# Patient Record
Sex: Male | Born: 1970 | Race: White | Hispanic: No | Marital: Married | Smoking: Never smoker
Health system: Southern US, Community
[De-identification: ages and names within clinical notes are randomized; demographics above are authoritative.]

## PROBLEM LIST (undated history)

## (undated) DIAGNOSIS — T8859XA Other complications of anesthesia, initial encounter: Secondary | ICD-10-CM

## (undated) DIAGNOSIS — R112 Nausea with vomiting, unspecified: Secondary | ICD-10-CM

## (undated) DIAGNOSIS — D680 Von Willebrand's disease: Principal | ICD-10-CM

## (undated) DIAGNOSIS — D699 Hemorrhagic condition, unspecified: Principal | ICD-10-CM

## (undated) DIAGNOSIS — Z9889 Other specified postprocedural states: Secondary | ICD-10-CM

## (undated) DIAGNOSIS — D691 Qualitative platelet defects: Secondary | ICD-10-CM

## (undated) DIAGNOSIS — G4730 Sleep apnea, unspecified: Secondary | ICD-10-CM

## (undated) DIAGNOSIS — C4490 Unspecified malignant neoplasm of skin, unspecified: Secondary | ICD-10-CM

## (undated) DIAGNOSIS — E119 Type 2 diabetes mellitus without complications: Secondary | ICD-10-CM

## (undated) DIAGNOSIS — I10 Essential (primary) hypertension: Secondary | ICD-10-CM

## (undated) DIAGNOSIS — M25569 Pain in unspecified knee: Secondary | ICD-10-CM

## (undated) DIAGNOSIS — D689 Coagulation defect, unspecified: Secondary | ICD-10-CM

## (undated) DIAGNOSIS — Z8052 Family history of malignant neoplasm of bladder: Secondary | ICD-10-CM

## (undated) HISTORY — DX: Qualitative platelet defects: D69.1

## (undated) HISTORY — DX: Hemorrhagic condition, unspecified: D69.9

## (undated) HISTORY — DX: Pain in unspecified knee: M25.569

## (undated) HISTORY — DX: Type 2 diabetes mellitus without complications: E11.9

## (undated) HISTORY — DX: Coagulation defect, unspecified: D68.9

## (undated) HISTORY — DX: Essential (primary) hypertension: I10

## (undated) HISTORY — DX: Unspecified malignant neoplasm of skin, unspecified: C44.90

## (undated) HISTORY — DX: Von Willebrand's disease: D68.0

## (undated) HISTORY — DX: Family history of malignant neoplasm of bladder: Z80.52

---

## 1998-06-23 HISTORY — PX: TONSILLECTOMY: SUR1361

## 2008-06-23 HISTORY — PX: KNEE ARTHROSCOPY: SHX127

## 2013-03-10 ENCOUNTER — Telehealth: Payer: Self-pay | Admitting: Hematology and Oncology

## 2013-03-10 NOTE — Telephone Encounter (Signed)
LVOM FOR PT TO RETURN CALL IN RE TO REFERRAL.  °

## 2013-03-14 ENCOUNTER — Telehealth: Payer: Self-pay | Admitting: Hematology and Oncology

## 2013-03-14 NOTE — Telephone Encounter (Signed)
LM WITH WIFE FOR PT TO RETURN CALL.

## 2013-03-16 ENCOUNTER — Telehealth: Payer: Self-pay | Admitting: Hematology and Oncology

## 2013-03-16 NOTE — Telephone Encounter (Signed)
S/w pt and gve np appt 10/07 @ 9:30 w/Dr. Bertis Ruddy Referring Dr. Salvatore Marvel  Dx- Pre-op clarence von willebrandsdx Welcome packet mailed.

## 2013-03-16 NOTE — Telephone Encounter (Signed)
C/D 03/16/13 for appt. 03/29/13 °

## 2013-03-16 NOTE — Telephone Encounter (Signed)
C/D 03/16/13 for appt. 03/29/13

## 2013-03-29 ENCOUNTER — Encounter: Payer: Self-pay | Admitting: Hematology and Oncology

## 2013-03-29 ENCOUNTER — Other Ambulatory Visit: Payer: Self-pay | Admitting: Lab

## 2013-03-29 ENCOUNTER — Ambulatory Visit (HOSPITAL_BASED_OUTPATIENT_CLINIC_OR_DEPARTMENT_OTHER): Payer: BC Managed Care – PPO | Admitting: Hematology and Oncology

## 2013-03-29 ENCOUNTER — Ambulatory Visit (HOSPITAL_BASED_OUTPATIENT_CLINIC_OR_DEPARTMENT_OTHER): Payer: BC Managed Care – PPO

## 2013-03-29 ENCOUNTER — Ambulatory Visit (HOSPITAL_BASED_OUTPATIENT_CLINIC_OR_DEPARTMENT_OTHER): Payer: BC Managed Care – PPO | Admitting: Lab

## 2013-03-29 ENCOUNTER — Other Ambulatory Visit: Payer: Self-pay | Admitting: Hematology and Oncology

## 2013-03-29 VITALS — BP 162/104 | HR 69 | Temp 97.8°F | Resp 20 | Ht 73.0 in | Wt 307.4 lb

## 2013-03-29 DIAGNOSIS — D6800 Von Willebrand disease, unspecified: Secondary | ICD-10-CM

## 2013-03-29 DIAGNOSIS — M25562 Pain in left knee: Secondary | ICD-10-CM

## 2013-03-29 DIAGNOSIS — M25569 Pain in unspecified knee: Secondary | ICD-10-CM

## 2013-03-29 HISTORY — DX: Pain in unspecified knee: M25.569

## 2013-03-29 HISTORY — DX: Von Willebrand's disease: D68.0

## 2013-03-29 HISTORY — DX: Von Willebrand disease, unspecified: D68.00

## 2013-03-29 LAB — CBC WITH DIFFERENTIAL/PLATELET
BASO%: 2.9 % — ABNORMAL HIGH (ref 0.0–2.0)
Basophils Absolute: 0.1 10*3/uL (ref 0.0–0.1)
EOS%: 3.1 % (ref 0.0–7.0)
Eosinophils Absolute: 0.1 10*3/uL (ref 0.0–0.5)
HCT: 40 % (ref 38.4–49.9)
HGB: 13.5 g/dL (ref 13.0–17.1)
LYMPH%: 23.1 % (ref 14.0–49.0)
MCH: 27.9 pg (ref 27.2–33.4)
MCHC: 33.7 g/dL (ref 32.0–36.0)
MCV: 82.7 fL (ref 79.3–98.0)
MONO#: 0.4 10*3/uL (ref 0.1–0.9)
MONO%: 9.5 % (ref 0.0–14.0)
NEUT#: 2.6 10*3/uL (ref 1.5–6.5)
NEUT%: 61.4 % (ref 39.0–75.0)
Platelets: 180 10*3/uL (ref 140–400)
RBC: 4.83 10*6/uL (ref 4.20–5.82)
RDW: 13.3 % (ref 11.0–14.6)
WBC: 4.2 10*3/uL (ref 4.0–10.3)
lymph#: 1 10*3/uL (ref 0.9–3.3)

## 2013-03-29 LAB — COMPREHENSIVE METABOLIC PANEL (CC13)
ALT: 39 U/L (ref 0–55)
AST: 36 U/L — ABNORMAL HIGH (ref 5–34)
Albumin: 3.8 g/dL (ref 3.5–5.0)
Alkaline Phosphatase: 75 U/L (ref 40–150)
Anion Gap: 7 mEq/L (ref 3–11)
BUN: 13.1 mg/dL (ref 7.0–26.0)
CO2: 29 mEq/L (ref 22–29)
Calcium: 9.3 mg/dL (ref 8.4–10.4)
Chloride: 106 mEq/L (ref 98–109)
Creatinine: 0.8 mg/dL (ref 0.7–1.3)
Glucose: 104 mg/dl (ref 70–140)
Potassium: 4.1 mEq/L (ref 3.5–5.1)
Sodium: 142 mEq/L (ref 136–145)
Total Bilirubin: 0.52 mg/dL (ref 0.20–1.20)
Total Protein: 7.2 g/dL (ref 6.4–8.3)

## 2013-03-29 NOTE — Progress Notes (Signed)
Checked in new pt with no financial concerns. °

## 2013-03-29 NOTE — Progress Notes (Signed)
[facility] CONSULT NOTE  CHIEF COMPLAINTS/PURPOSE OF CONSULTATION: Von Willebrand disease, needs surgical clearance prior to knee replacement surgery  HISTORY OF PRESENTING ILLNESS:  Paul Curry 42 y.o. male is a pleasant gentleman with a prior history of von Willebrand disease. The patient apparently was diagnosed at 34-year-old. At that time, the patient bit his tongue by accident and the bled continuously for one week. He denies any need for blood transfusion at that time. After he was diagnosed, he was being followed by a hematologist. According to the patient, he was circumcised at birth but he never had problem that he is aware of. At around age 40, he had tonsillectomy without any hematological workup. He bled for one week. He subsequently had to go back into the hospital and have the bleeding site cauterized to stop the bleeding. Again he denies any need for blood transfusion. The patient has cyst and skin cancer removed from his body on and off over the years and at one time he required some sort of infusion which he believes the DDAVP. His maternal grandmother had von Willebrand disease. His mother was never symptomatic. His son is born normal without any bleeding disorder but his daughter was tested positive. She requires birth control to stop her heavy bleeding. He uses a hematologist in week for [name] but was lost to followup over the past few years. The patient does complain of easy bruising. He denies any spontaneous bleeding such as epistaxis, hematuria, or hematochezia. The patient injured his left knee since age 64 and over the past many years needed multiple arthroscopy evaluation. Subsequently he was recommended to have a knee replacement surgery and hence he is being referred her for surgical clearance due to his history of bleeding disorder for his knee pain, has been taking some ibuprofen regularly.Marland Kitchen   MEDICAL HISTORY:  Past Medical History  Diagnosis Date  .  Hypertension   . Skin cancer   . Clotting disorder   . Von Willebrand disease 03/29/2013  . Knee joint pain 03/29/2013    SURGICAL HISTORY: Past Surgical History  Procedure Laterality Date  . Tonsillectomy Bilateral 2000  . Knee arthroscopy Left 2010    SOCIAL HISTORY: History   Social History  . Marital Status: Married    Spouse Name: N/A    Number of Children: N/A  . Years of Education: N/A   Occupational History  . Not on file.   Social History Main Topics  . Smoking status: Never Smoker   . Smokeless tobacco: Never Used  . Alcohol Use: 0.6 oz/week    1 Glasses of wine per week  . Drug Use: No  . Sexual Activity: Not on file   Other Topics Concern  . Not on file   Social History Narrative  . No narrative on file    FAMILY HISTORY: Family History  Problem Relation Age of Onset  . Clotting disorder Mother     possibly, not diagnosed  . Clotting disorder Maternal Grandmother     possibly had it, not formally diagnosed    ALLERGIES:  has no allergies on file.  MEDICATIONS: Current outpatient prescriptions:amLODipine (NORVASC) 5 MG tablet, Take 5 mg by mouth daily., Disp: , Rfl: ;  hydrochlorothiazide (HYDRODIURIL) 12.5 MG tablet, , Disp: , Rfl: ;  montelukast (SINGULAIR) 10 MG tablet, Take 10 mg by mouth at bedtime., Disp: , Rfl: ;  omeprazole (PRILOSEC) 20 MG capsule, Take 20 mg by mouth daily., Disp: , Rfl: ;  PARoxetine (PAXIL)  20 MG tablet, , Disp: , Rfl:  HYDROcodone-acetaminophen (NORCO/VICODIN) 5-325 MG per tablet, , Disp: , Rfl:   REVIEW OF SYSTEMS:   Constitutional: Denies fevers, chills or abnormal weight loss Eyes: Denies blurriness of vision, double vision or watery eyes Ears, nose, mouth, throat, and face: Denies mucositis or sore throat Respiratory: Denies cough, dyspnea or wheezes Cardiovascular: Denies palpitation, chest discomfort or lower extremity swelling Gastrointestinal:  Denies nausea, heartburn or change in bowel habits Skin: Denies  abnormal skin rashes Lymphatics: Denies new lymphadenopathy or easy bruising Neurological:Denies numbness, tingling or new weaknesses Behavioral/Psych: Mood is stable, no new changes  All other systems were reviewed with the patient and are negative.  PHYSICAL EXAMINATION: ECOG PERFORMANCE STATUS: 1 - Symptomatic but completely ambulatory  Filed Vitals:   03/29/13 1005  BP: 162/104  Pulse: 69  Temp: 97.8 F (36.6 C)  Resp: 20   Filed Weights   03/29/13 1005  Weight: 307 lb 6.4 oz (139.436 kg)    GENERAL:alert, no distress and comfortable patient is moderately obese  SKIN: skin color, texture, turgor are normal, no rashes or significant lesions EYES: normal, conjunctiva are pink and non-injected, sclera clear OROPHARYNX:no exudate, no erythema and lips, buccal mucosa, and tongue normal  NECK: supple, thyroid normal size, non-tender, without nodularity LYMPH:  no palpable lymphadenopathy in the cervical, axillary or inguinal LUNGS: clear to auscultation and percussion with normal breathing effort HEART: regular rate & rhythm and no murmurs and no lower extremity edema ABDOMEN:abdomen soft, non-tender and normal bowel sounds Musculoskeletal:no cyanosis of digits and no clubbing  his left knee looks normal although this a brace over it is no evidence of bleeding into the knee joint  PSYCH: alert & oriented x 3 with fluent speech NEURO: no focal motor/sensory deficits  LABORATORY DATA:  I have reviewed the data as listed Recent Results (from the past 2160 hour(s))  CBC WITH DIFFERENTIAL     Status: Abnormal   Collection Time    03/29/13 11:04 AM      Result Value Range   WBC 4.2  4.0 - 10.3 10e3/uL   NEUT# 2.6  1.5 - 6.5 10e3/uL   HGB 13.5  13.0 - 17.1 g/dL   HCT 04.5  40.9 - 81.1 %   Platelets 180  140 - 400 10e3/uL   MCV 82.7  79.3 - 98.0 fL   MCH 27.9  27.2 - 33.4 pg   MCHC 33.7  32.0 - 36.0 g/dL   RBC 9.14  7.82 - 9.56 10e6/uL   RDW 13.3  11.0 - 14.6 %   lymph# 1.0   0.9 - 3.3 10e3/uL   MONO# 0.4  0.1 - 0.9 10e3/uL   Eosinophils Absolute 0.1  0.0 - 0.5 10e3/uL   Basophils Absolute 0.1  0.0 - 0.1 10e3/uL   NEUT% 61.4  39.0 - 75.0 %   LYMPH% 23.1  14.0 - 49.0 %   MONO% 9.5  0.0 - 14.0 %   EOS% 3.1  0.0 - 7.0 %   BASO% 2.9 (*) 0.0 - 2.0 %  COMPREHENSIVE METABOLIC PANEL (CC13)     Status: Abnormal   Collection Time    03/29/13 11:05 AM      Result Value Range   Sodium 142  136 - 145 mEq/L   Potassium 4.1  3.5 - 5.1 mEq/L   Chloride 106  98 - 109 mEq/L   CO2 29  22 - 29 mEq/L   Glucose 104  70 - 140 mg/dl  BUN 13.1  7.0 - 26.0 mg/dL   Creatinine 0.8  0.7 - 1.3 mg/dL   Total Bilirubin 9.56  0.20 - 1.20 mg/dL   Alkaline Phosphatase 75  40 - 150 U/L   AST 36 (*) 5 - 34 U/L   ALT 39  0 - 55 U/L   Total Protein 7.2  6.4 - 8.3 g/dL   Albumin 3.8  3.5 - 5.0 g/dL   Calcium 9.3  8.4 - 21.3 mg/dL   Anion Gap 7  3 - 11 mEq/L   ASSESSMENT:  Von Willebrand disease, requiring clearance for knee replacement surgery and perioperative management   PLAN:  #1 von Willebrand disease Discuss with the patient I am uncertain of the type of von Willebrand disease that he has and the degree of severity. Even he has mild von Willebrand disease, we could potentially bring him back for a DDAVP challenge and if you respond well to DDAVP challenge, we can manage him in the perioperative setting in here. However, if he has severe von Willebrand disease, then I would recommend referral to tertiary center for further management. In the meantime I will go ahead and order some blood work today for further evaluation. In the meantime, his left knee pain is not going to require urgent surgery and will plan this is as an elective surgery until this workup is complete. #2 severe knee pain I recommended he avoid nonsteroidal anti-inflammatory medication and to use over the counter extra strength Tylenol only for pain control.  All questions were answered. The patient knows to call the  clinic with any problems, questions or concerns. I can certainly see the patient much sooner if necessary. No barriers to learning was detected.  I spent 25 minutes counseling the patient face to face. The total time spent in the appointment was 48 minutes and more than 50% was on counseling.     [facility], Lillyauna Jenkinson, MD 03/29/2013 12:47 PM

## 2013-04-01 ENCOUNTER — Telehealth: Payer: Self-pay | Admitting: *Deleted

## 2013-04-01 LAB — VON WILLEBRAND FACTOR MULTIMER
Factor-VIII Activity: 128 % (ref 50–180)
Ristocetin Co-Factor: 97 % (ref 42–200)
Von Willebrand Factor Ag: 98 % (ref 50–217)

## 2013-04-01 LAB — PROTHROMBIN TIME
INR: 1 (ref <1.50)
Prothrombin Time: 13.2 seconds (ref 11.6–15.2)

## 2013-04-01 LAB — APTT: aPTT: 37 seconds (ref 24–37)

## 2013-04-01 NOTE — Telephone Encounter (Signed)
VM from [facility] at Dr. Sherene Sires office.  She requests our fax number to send [name] form for release for surgery from Hematology standpoint.  Attempted to return call after 5 pm and was unable to leave a message.  Request desk nurse to return her call w/ our fax number on Monday.   Contact number is #[phone],  Ext [phone].

## 2013-04-12 ENCOUNTER — Ambulatory Visit: Payer: BC Managed Care – PPO | Admitting: Hematology and Oncology

## 2013-04-12 ENCOUNTER — Other Ambulatory Visit: Payer: Self-pay | Admitting: Hematology and Oncology

## 2013-04-12 ENCOUNTER — Telehealth: Payer: Self-pay | Admitting: *Deleted

## 2013-04-12 ENCOUNTER — Encounter: Payer: Self-pay | Admitting: Hematology and Oncology

## 2013-04-12 DIAGNOSIS — D699 Hemorrhagic condition, unspecified: Secondary | ICD-10-CM

## 2013-04-12 HISTORY — DX: Hemorrhagic condition, unspecified: D69.9

## 2013-04-12 NOTE — Telephone Encounter (Signed)
Pt was unaware of his visit today,  Thought it was for this Thursday.  He can come in for lab tomorrow at 10 am.

## 2013-04-12 NOTE — Telephone Encounter (Signed)
I will put in order for tomorrow for platelet aggregation study and see me about 1 week after, 15 minutes appt

## 2013-04-12 NOTE — Telephone Encounter (Signed)
Placed order

## 2013-04-12 NOTE — Telephone Encounter (Signed)
Message copied by Wende Mott on Tue Apr 12, 2013  2:18 PM ------      Message from: [facility] [place]      Created: Tue Apr 12, 2013  1:40 PM       Patient is a no-show today            Work-up was negative but he did not have platelet aggregation study done            Would you mind call him and see if he is willing to coming for additional testing so we can clear him for surgery?            Thanks ------

## 2013-04-13 ENCOUNTER — Other Ambulatory Visit: Payer: BC Managed Care – PPO | Admitting: Lab

## 2013-04-13 DIAGNOSIS — D699 Hemorrhagic condition, unspecified: Secondary | ICD-10-CM

## 2013-04-25 ENCOUNTER — Ambulatory Visit (HOSPITAL_BASED_OUTPATIENT_CLINIC_OR_DEPARTMENT_OTHER): Payer: BC Managed Care – PPO | Admitting: Hematology and Oncology

## 2013-04-25 VITALS — BP 171/101 | HR 79 | Temp 98.1°F | Resp 21 | Ht 73.0 in | Wt 318.4 lb

## 2013-04-25 DIAGNOSIS — M1990 Unspecified osteoarthritis, unspecified site: Secondary | ICD-10-CM

## 2013-04-25 DIAGNOSIS — D699 Hemorrhagic condition, unspecified: Secondary | ICD-10-CM

## 2013-04-25 DIAGNOSIS — M25562 Pain in left knee: Secondary | ICD-10-CM

## 2013-04-25 LAB — PLATELET AGGREGATION STUDY, BLOOD
ADP10: NORMAL
ADP5: NORMAL
Arachidonic Acid: NORMAL
COMMENT (PLATELET AGGREGATE): NORMAL
Collagen Aggregation: NORMAL
Epinephrine 10: NORMAL
Ristocetin: DECREASED
Thrombin Receptor: NORMAL

## 2013-04-25 NOTE — Progress Notes (Signed)
[facility] OFFICE PROGRESS NOTE  No primary provider on file. DIAGNOSIS:  Unspecified bleeding disorder  SUMMARY OF HEMATOLOGIC HISTORY: This patient was referred here to get surgical clearance for prior to knee replacement surgery. He had prior diagnosis of von Willebrand's disease, unknown type. INTERVAL HISTORY: Paul Curry 42 y.o. male returns for further followup. The patient denies any recent signs or symptoms of bleeding such as spontaneous epistaxis, hematuria or hematochezia.  I have reviewed the past medical history, past surgical history, social history and family history with the patient and they are unchanged from previous note.  ALLERGIES:  has No Known Allergies.  MEDICATIONS:  Current Outpatient Prescriptions  Medication Sig Dispense Refill  . acetaminophen (TYLENOL) 325 MG tablet Take 650 mg by mouth every 6 (six) hours as needed for pain.      Marland Kitchen amLODipine (NORVASC) 5 MG tablet Take 5 mg by mouth daily.      . hydrochlorothiazide (HYDRODIURIL) 12.5 MG tablet Take 25 mg by mouth daily.       . montelukast (SINGULAIR) 10 MG tablet Take 10 mg by mouth at bedtime.      Marland Kitchen omeprazole (PRILOSEC) 20 MG capsule Take 20 mg by mouth daily.      Marland Kitchen PARoxetine (PAXIL) 20 MG tablet        No current facility-administered medications for this visit.     REVIEW OF SYSTEMS:   Constitutional: Denies fevers, chills or night sweats All other systems were reviewed with the patient and are negative.  PHYSICAL EXAMINATION: ECOG PERFORMANCE STATUS: 0 - Asymptomatic  Filed Vitals:   04/25/13 1336  BP: 171/101  Pulse: 79  Temp: 98.1 F (36.7 C)  Resp: 21   Filed Weights   04/25/13 1336  Weight: 318 lb 6.4 oz (144.425 kg)    GENERAL:alert, no distress and comfortable NEURO: alert & oriented x 3 with fluent speech, no focal motor/sensory deficits  LABORATORY DATA:  I have reviewed the data as listed. Von Willebrand panel came back normal. Platelet  aggregation studies show no spontaneous aggregation in 20 minutes with marked decreased response to Ristocetin   ASSESSMENT:  Bleeding disorder  PLAN:  #1 bleeding disorder #2 severe degenerative joint disease requiring surgery Unfortunately, I do not have a diagnosis. Von Willebrand panel came back normal. Platelet aggregation study came back abnormal. The patient has known bleeding history which suggested some sort of bleeding disorder. I discussed this with my colleagues and we both agreed that the patient should receive a second opinion at [facility] before he proceed with his knee surgery. Unfortunately I cannot clear him for surgery. I recommend referral as discussed above. The patient is in agreement to proceed. All questions were answered. The patient knows to call the clinic with any problems, questions or concerns. No barriers to learning was detected.  I spent 20 minutes counseling the patient face to face. The total time spent in the appointment was 25 minutes and more than 50% was on counseling.     [facility], Adiyah Lame, MD 04/25/2013 2:28 PM

## 2013-04-27 ENCOUNTER — Telehealth: Payer: Self-pay | Admitting: Hematology and Oncology

## 2013-04-27 NOTE — Telephone Encounter (Signed)
Faxed pt medical records to [facility].   They will call with appt.

## 2013-04-29 ENCOUNTER — Telehealth: Payer: Self-pay | Admitting: *Deleted

## 2013-04-29 NOTE — Telephone Encounter (Signed)
Pt left VM states he still has not heard about his referral to [facility]  S/w Laurel Dimmer in med records.  Referral was made on 11/05 and records were sent to [facility].  Informed pt of this and [facility] should be contacting him next week w/ appt d/t.  Please call us back if he doesn't hear anything by end of next week.  He verbalized understanding.

## 2013-05-06 ENCOUNTER — Telehealth: Payer: Self-pay | Admitting: Hematology and Oncology

## 2013-05-06 NOTE — Telephone Encounter (Signed)
Pt appt. With Dr. Marcheta Grammes is 06/10/13@10 :00. Medical records faxed. Pt is aware

## 2013-05-10 ENCOUNTER — Encounter (HOSPITAL_COMMUNITY): Payer: Self-pay | Admitting: Pharmacy Technician

## 2013-05-12 ENCOUNTER — Inpatient Hospital Stay (HOSPITAL_COMMUNITY): Admission: RE | Admit: 2013-05-12 | Payer: BC Managed Care – PPO | Source: Ambulatory Visit

## 2013-05-23 ENCOUNTER — Inpatient Hospital Stay (HOSPITAL_COMMUNITY)
Admission: RE | Admit: 2013-05-23 | Payer: BC Managed Care – PPO | Source: Ambulatory Visit | Admitting: Orthopedic Surgery

## 2013-05-23 ENCOUNTER — Encounter (HOSPITAL_COMMUNITY): Admission: RE | Payer: Self-pay | Source: Ambulatory Visit

## 2013-05-23 SURGERY — ARTHROPLASTY, KNEE, TOTAL
Anesthesia: General | Laterality: Left

## 2016-06-19 ENCOUNTER — Encounter: Payer: Self-pay | Admitting: Gastroenterology

## 2016-07-10 ENCOUNTER — Ambulatory Visit: Payer: Self-pay | Admitting: Gastroenterology

## 2016-07-31 ENCOUNTER — Encounter: Payer: Self-pay | Admitting: Gastroenterology

## 2016-07-31 ENCOUNTER — Telehealth: Payer: Self-pay | Admitting: Gastroenterology

## 2016-07-31 ENCOUNTER — Ambulatory Visit: Payer: Self-pay | Admitting: Gastroenterology

## 2016-07-31 NOTE — Telephone Encounter (Signed)
PATIENT WAS A NO SHOW AND LETTER SENT  °

## 2018-05-31 ENCOUNTER — Encounter: Payer: Self-pay | Admitting: Gastroenterology

## 2018-08-18 ENCOUNTER — Ambulatory Visit: Payer: Self-pay | Admitting: Gastroenterology

## 2018-08-18 ENCOUNTER — Encounter: Payer: Self-pay | Admitting: Gastroenterology

## 2019-08-24 ENCOUNTER — Encounter: Payer: Self-pay | Admitting: Internal Medicine

## 2019-09-17 ENCOUNTER — Encounter: Payer: Self-pay | Admitting: Gastroenterology

## 2019-09-17 NOTE — Progress Notes (Deleted)
Referring Provider: Melton Krebs, NP  Primary Care Physician:  System, Pcp Not In Primary Gastroenterologist:  Dr. Gala Romney  No chief complaint on file.   HPI:   Paul Curry is a 49 y.o. male presenting today at the request of Melton Krebs, NP for EGD and colonoscopy.   History of abnormal bleeding characterized by extensive hemorrhage after surgical procedures. Diagnosed with qualitative platelet defect, previously carried diagnosis of von Willebrand disease. Upon review on hematology notes from 2015, patient has been advised to have DDAVP and platelets prior to a knee replacement with overnight monitoring thereafter.     Past Medical History:  Diagnosis Date  . Bleeding disorder 04/12/2013  . Clotting disorder   . Hypertension   . Knee joint pain 03/29/2013  . Skin cancer   . Von Willebrand disease 03/29/2013    Past Surgical History:  Procedure Laterality Date  . KNEE ARTHROSCOPY Left 2010  . TONSILLECTOMY Bilateral 2000    Current Outpatient Medications  Medication Sig Dispense Refill  . acetaminophen (TYLENOL) 325 MG tablet Take 650 mg by mouth every 6 (six) hours as needed for pain.    Marland Kitchen amLODipine (NORVASC) 5 MG tablet Take 5 mg by mouth daily.    . hydrochlorothiazide (HYDRODIURIL) 12.5 MG tablet Take 25 mg by mouth daily.     . montelukast (SINGULAIR) 10 MG tablet Take 10 mg by mouth at bedtime.    Marland Kitchen omeprazole (PRILOSEC) 20 MG capsule Take 20 mg by mouth daily.    Marland Kitchen PARoxetine (PAXIL) 20 MG tablet Take 20 mg by mouth daily.     Marland Kitchen PRESCRIPTION MEDICATION Inject 1 Applicatorful as directed once a week. Allergy shot     No current facility-administered medications for this visit.    Allergies as of 09/19/2019  . (No Known Allergies)    Family History  Problem Relation Age of Onset  . Clotting disorder Mother        possibly, not diagnosed  . Clotting disorder Maternal Grandmother        possibly had it, not formally diagnosed    Social  History   Socioeconomic History  . Marital status: Married    Spouse name: Not on file  . Number of children: Not on file  . Years of education: Not on file  . Highest education level: Not on file  Occupational History  . Not on file  Tobacco Use  . Smoking status: Never Smoker  . Smokeless tobacco: Never Used  Substance and Sexual Activity  . Alcohol use: Yes    Alcohol/week: 1.0 standard drinks    Types: 1 Glasses of wine per week  . Drug use: No  . Sexual activity: Not on file  Other Topics Concern  . Not on file  Social History Narrative  . Not on file   Social Determinants of Health   Financial Resource Strain:   . Difficulty of Paying Living Expenses:   Food Insecurity:   . Worried About Charity fundraiser in the Last Year:   . Arboriculturist in the Last Year:   Transportation Needs:   . Film/video editor (Medical):   Marland Kitchen Lack of Transportation (Non-Medical):   Physical Activity:   . Days of Exercise per Week:   . Minutes of Exercise per Session:   Stress:   . Feeling of Stress :   Social Connections:   . Frequency of Communication with Friends and Family:   . Frequency of Social Gatherings with  Friends and Family:   . Attends Religious Services:   . Active Member of Clubs or Organizations:   . Attends Archivist Meetings:   Marland Kitchen Marital Status:   Intimate Partner Violence:   . Fear of Current or Ex-Partner:   . Emotionally Abused:   Marland Kitchen Physically Abused:   . Sexually Abused:     Review of Systems: Gen: Denies any fever, chills, fatigue, weight loss, lack of appetite.  CV: Denies chest pain, heart palpitations, peripheral edema, syncope.  Resp: Denies shortness of breath at rest or with exertion. Denies wheezing or cough.  GI: Denies dysphagia or odynophagia. Denies jaundice, hematemesis, fecal incontinence. GU : Denies urinary burning, urinary frequency, urinary hesitancy MS: Denies joint pain, muscle weakness, cramps, or limitation of  movement.  Derm: Denies rash, itching, dry skin Psych: Denies depression, anxiety, memory loss, and confusion Heme: Denies bruising, bleeding, and enlarged lymph nodes.  Physical Exam: There were no vitals taken for this visit. General:   Alert and oriented. Pleasant and cooperative. Well-nourished and well-developed.  Head:  Normocephalic and atraumatic. Eyes:  Without icterus, sclera clear and conjunctiva pink.  Ears:  Normal auditory acuity. Nose:  No deformity, discharge,  or lesions. Mouth:  No deformity or lesions, oral mucosa pink.  Neck:  Supple, without mass or thyromegaly. Lungs:  Clear to auscultation bilaterally. No wheezes, rales, or rhonchi. No distress.  Heart:  S1, S2 present without murmurs appreciated.  Abdomen:  +BS, soft, non-tender and non-distended. No HSM noted. No guarding or rebound. No masses appreciated.  Rectal:  Deferred  Msk:  Symmetrical without gross deformities. Normal posture. Pulses:  Normal pulses noted. Extremities:  Without clubbing or edema. Neurologic:  Alert and  oriented x4;  grossly normal neurologically. Skin:  Intact without significant lesions or rashes. Cervical Nodes:  No significant cervical adenopathy. Psych:  Alert and cooperative. Normal mood and affect.

## 2019-09-19 ENCOUNTER — Telehealth: Payer: Self-pay | Admitting: Internal Medicine

## 2019-09-19 ENCOUNTER — Ambulatory Visit: Payer: BC Managed Care – PPO | Admitting: Gastroenterology

## 2019-09-19 NOTE — Telephone Encounter (Signed)
PATIENT NO SHOW X 3 FOR NEW PATIENT APPOINTMENT

## 2019-09-19 NOTE — Telephone Encounter (Signed)
Reviewed

## 2019-10-16 NOTE — Progress Notes (Signed)
Referring Provider: Wandra Scot, NP Primary Care Physician:  System, Pcp Not In Primary Gastroenterologist:  Dr. Gala Romney  Chief Complaint  Patient presents with  . Consult    TCS last done 15-20 years ago  . Dysphagia    getting choked on solid foods x few years getting worse    HPI:   Paul Curry is a 49 y.o. male presenting today at the request of Wandra Scot, NP for consult colonoscopy and EGD.   History of bleeding disorder with qualitative platelet defect. 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 Platelet function improved with administration of DDAVP. Prior to knee surgery in 2015, it was recommended he receive DDAVP 0.64mcg/kg, and he should be monitored for any bleeding complications overnight.  Today:  Last colonoscopy about 15 years ago. Performed in [place] by Dr. West Carbo. No polyps. No abdominal pain. BMs daily. No constipation or diarrhea. No brbpr or melena. No unintentional weight loss.   Has not had any trouble with bleeding. Has had desmopressin prior to procedures (cyst removal and knee surgery). Not on any chronic medications for this.   Dysphagia: Gets chocked easily. States he has "passed out" multiple times due to coughing. Present for a few years. Worsening over the last year. When he swallows, foods get hung at the sternal notch. More with meats and breads. No trouble with liquids or soft foods. Usually food will always come back up. Has chronic history of GERD. Takes omeprazole 40 mg. Typically this controls GERD symptoms well. Sometimes will have breakthrough with spicy foods.  No nausea or vomiting. No pill dysphagia.   History of sleep apnea on CPAP.   BP is elevated today.  Hasn't taken BP medication this morning.   Past Medical History:  Diagnosis Date  . Bleeding disorder ([place]) 04/12/2013  . Clotting disorder ([place])   .  Diabetes ([place])   . Hypertension   . Knee joint pain 03/29/2013  . Qualitative platelet defect ([place])   . Skin cancer   . Von Willebrand disease ([place]) 03/29/2013    Past Surgical History:  Procedure Laterality Date  . KNEE ARTHROSCOPY Left 2010  . TONSILLECTOMY Bilateral 2000    Current Outpatient Medications  Medication Sig Dispense Refill  . acetaminophen (TYLENOL) 325 MG tablet Take 650 mg by mouth every 6 (six) hours as needed for pain.    Marland Kitchen amLODipine (NORVASC) 10 MG tablet Take 10 mg by mouth daily.     . metFORMIN (GLUCOPHAGE) 1000 MG tablet Take 1,000 mg by mouth at bedtime.    . montelukast (SINGULAIR) 10 MG tablet Take 10 mg by mouth at bedtime.    Marland Kitchen omeprazole (PRILOSEC) 40 MG capsule Take 40 mg by mouth daily.     Marland Kitchen PARoxetine (PAXIL) 20 MG tablet Take 20 mg by mouth daily.     Marland Kitchen terazosin (HYTRIN) 2 MG capsule Take 2 mg by mouth at bedtime.    . valsartan-hydrochlorothiazide (DIOVAN-HCT) 160-25 MG tablet Take 1 tablet by mouth daily.     No current facility-administered medications for this visit.    Allergies as of 10/17/2019  . (No Known Allergies)    Family History  Problem Relation Age of Onset  . Clotting disorder Mother        possibly, not diagnosed  . Colon cancer Mother        diagnosed in her 49s  . Clotting disorder Maternal Grandmother  possibly had it, not formally diagnosed  . Colon cancer Maternal Grandmother        diagnosed in her 12s    Social History   Socioeconomic History  . Marital status: Married    Spouse name: Not on file  . Number of children: Not on file  . Years of education: Not on file  . Highest education level: Not on file  Occupational History  . Not on file  Tobacco Use  . Smoking status: Never Smoker  . Smokeless tobacco: Never Used  Substance and Sexual Activity  . Alcohol use: Yes    Alcohol/week: 1.0 standard drinks    Types: 1 Glasses of wine per week    Comment: couple beer a week.   . Drug use: No   . Sexual activity: Not on file  Other Topics Concern  . Not on file  Social History Narrative  . Not on file   Social Determinants of Health   Financial Resource Strain:   . Difficulty of Paying Living Expenses:   Food Insecurity:   . Worried About Charity fundraiser in the Last Year:   . Arboriculturist in the Last Year:   Transportation Needs:   . Film/video editor (Medical):   Marland Kitchen Lack of Transportation (Non-Medical):   Physical Activity:   . Days of Exercise per Week:   . Minutes of Exercise per Session:   Stress:   . Feeling of Stress :   Social Connections:   . Frequency of Communication with Friends and Family:   . Frequency of Social Gatherings with Friends and Family:   . Attends Religious Services:   . Active Member of Clubs or Organizations:   . Attends Archivist Meetings:   Marland Kitchen Marital Status:   Intimate Partner Violence:   . Fear of Current or Ex-Partner:   . Emotionally Abused:   Marland Kitchen Physically Abused:   . Sexually Abused:     Review of Systems: Gen: Denies any fever, chills, cold or flu like symptoms, lightheadedness, dizziness, or syncope other than in the setting of dysphagia as discussed above.  CV: Denies chest pain or heart palpitations Resp: Denies shortness of breath or cough other than with dysphagia as discussed in HPI.  GI: See HPI GU : Denies urinary burning, urinary frequency, urinary hesitancy MS: Taking a lot of ibuprofen and tylenol for chronic right knee pain. Needs knee replacement. [phone] mg ibuprofen daily. No goody powders.   Derm: Denies rash Psych: Denies depression or anxiety Heme: See HPI  Physical Exam: BP (!) 146/99   Pulse 82   Temp (!) 97.5 F (36.4 C) (Oral)   Ht 6' (1.829 m)   Wt (!) 303 lb 12.8 oz (137.8 kg)   BMI 41.20 kg/m  General:   Alert and oriented. Pleasant and cooperative. Well-nourished and well-developed.  Head:  Normocephalic and atraumatic. Eyes:  Without icterus, sclera clear and  conjunctiva pink.  Ears:  Normal auditory acuity. Mouth:  No deformity or lesions, oral mucosa pink.  Neck:  Supple, without mass or thyromegaly. Lungs:  Clear to auscultation bilaterally. No wheezes, rales, or rhonchi. No distress.  Heart:  S1, S2 present without murmurs appreciated.  Abdomen:  +BS, soft, non-tender and non-distended. No HSM noted. No guarding or rebound. No masses appreciated.  Rectal:  Deferred  Msk:  Symmetrical without gross deformities. Normal posture. Extremities:  With 1+ bilateral LE pitting edema. Neurologic:  Alert and  oriented x4;  grossly  normal neurologically. Skin:  Intact without significant lesions or rashes.. Psych: Normal mood and affect.

## 2019-10-17 ENCOUNTER — Other Ambulatory Visit: Payer: Self-pay

## 2019-10-17 ENCOUNTER — Encounter: Payer: Self-pay | Admitting: Gastroenterology

## 2019-10-17 ENCOUNTER — Ambulatory Visit (INDEPENDENT_AMBULATORY_CARE_PROVIDER_SITE_OTHER): Payer: BC Managed Care – PPO | Admitting: Gastroenterology

## 2019-10-17 DIAGNOSIS — R1310 Dysphagia, unspecified: Secondary | ICD-10-CM | POA: Diagnosis not present

## 2019-10-17 DIAGNOSIS — Z1211 Encounter for screening for malignant neoplasm of colon: Secondary | ICD-10-CM | POA: Diagnosis not present

## 2019-10-17 NOTE — Assessment & Plan Note (Addendum)
Patient reports few year history of dysphagia with sensation of food getting hung at the sternal notch.  Typically occurs with meats and breads.  No trouble with liquids or soft foods.  States he has passed out secondary to coughing trying to get the food up.  Typically the food does come back up rather than going down.  Symptoms have been worsening over the last year.  Chronic history of GERD but this is well controlled on omeprazole 40 mg daily.  Occasional breakthrough with spicy foods.  No other significant upper GI symptoms.  Differentials include esophageal web, ring, or stricture.  Cannot rule out malignancy.  He will need EGD for further evaluation and therapeutic intervention.  Due to history of platelet defect with clotting disorder that has required desmopressin prior to surgical procedures, will discuss this with Dr. Gala Romney to determine if desmopressin is needed prior to EGD/ED. Further recommendations to follow.   In the meantime, he was advised: Continue omeprazole 40 mg daily. Avoid reflux triggers. Consume only tender meats (poultry and fish) that are chopped finely.   Chew well, eat slowly, and drink plenty of fluids throughout meals. Limit ibuprofen as much as possible.  If something were to get hung in his esophagus and not come up or go down, he was advised to proceed to the emergency room.

## 2019-10-17 NOTE — Assessment & Plan Note (Addendum)
49 year old male with history of qualitative platelet defect/clotting disorder, HTN, diabetes, GERD presenting to schedule colonoscopy.  Per patient, last colonoscopy 15 years ago in [place] with Dr. West Carbo.  Denies history of colon polyps.  No significant lower GI symptoms.  No BRBPR, melena, or unintentional weight loss.  Family history significant for mother with colon cancer in her 67s and maternal grandmother with colon cancer in her 16s.  Patient needs colonoscopy for colon cancer screening.  Due to clotting disorder with platelet defect that has historically required DDAVP prior to surgical procedures, will discuss further with Dr. Gala Romney whether or not DDAVP is needed prior to scheduling.  Further recommendations to follow.  Requesting prior TCS records.

## 2019-10-17 NOTE — Patient Instructions (Signed)
I am going to reach out to Dr. Gala Romney prior to scheduling your colonoscopy and upper endoscopy with possible dilation due to your history of platelet dysfunction.  I should be in contact with you in the next 1-2 weeks.  If you have not heard from our office in 2 weeks, please call us back.  In the meantime, please ensure you are only consuming tender meats (poultry and fish) that are chopped finely.  Ensure you are chewing well, eat slowly, and drinking plenty of fluids throughout your meals.  Should something get hung in your esophagus and not come up or go down, you should proceed to the emergency room.  Continue taking your omeprazole 40 mg daily 30 minutes before breakfast. Avoid foods that trigger your reflux symptoms.  Classic trigger foods include spicy, fried, fatty, greasy, citrus foods.  Try limiting ibuprofen as much as possible.  Use Tylenol as needed.  No more than 3 g of Tylenol daily.  Aliene Altes, PA-C [facility]

## 2019-10-25 ENCOUNTER — Telehealth: Payer: Self-pay | Admitting: Gastroenterology

## 2019-10-25 NOTE — Telephone Encounter (Signed)
Tried calling pt on HM number, VM is full. Called mobile number, VM didn't pick up. Will call pt back.

## 2019-10-25 NOTE — Telephone Encounter (Signed)
[name], please let patient know the following:  I spoke with Dr. Gala Romney about need for desmopressin prior to colonoscopy/endoscopy with dilation.  He recommended I reach out to hematology to determine if this was still a reasonable option and if so, he would recommend desmopressin.  Spoke with [name], RN with [facility] hematology.  Per Dr. Gaylyn Cheers, who patient last saw in 2015, she recommends patient having follow-up in clinic prior to making formal recommendations.  Advised that patient call their office to schedule an appointment.  He should be able to be seen within the next 6 weeks.  [name], RN also requested we send information to their office regarding what procedures we are pursuing, where they will be performed, and what recommendations are needed.  Patient should call hematology office at [phone], option 1 for scheduling.  He should request office visit ASAP and let us know what date he gets scheduled for.  Please fax information to [phone], Attention Dr. Gaylyn Cheers. Requesting hematology clearance/recommendations regarding medications needed prior to GI procedures (listed below) due to history of platelet defect/clotting disorder. Procedure: Colonoscopy with possible polypectomy, EGD with possible esophageal dilation. Location: [facility] Please fax back information back to [facility], Attention Aliene Altes, PA-C

## 2019-11-01 NOTE — Telephone Encounter (Signed)
Can we try reaching out to patient again?

## 2019-11-02 ENCOUNTER — Telehealth: Payer: Self-pay | Admitting: Internal Medicine

## 2019-11-02 NOTE — Telephone Encounter (Signed)
Spoke with pt. Pt notified of [place] recommendations. Pt was given hematology's office phone number and pt will contact them to schedule an apt ASAP. Letter faxed and waiting on a response from the hematology office.

## 2019-11-02 NOTE — Telephone Encounter (Signed)
Left a message on cell phone. Home number rings and a busy signal starts, no VM. Waiting on a return call.

## 2019-11-02 NOTE — Telephone Encounter (Signed)
Tried to leave a VM on pts home number and it's full. Called pts cell and the call couldn't complete.

## 2019-11-02 NOTE — Telephone Encounter (Signed)
Pt said he was returning a call from AM. [phone]

## 2019-11-04 NOTE — Telephone Encounter (Signed)
Received a response from Nurse Consultant Rickard Patience RN, [facility]. Pt is scheduled with Dr. Gaylyn Cheers to re-establish care on 12/07/19. Recommendations will be provided after clinic appointment.

## 2019-11-04 NOTE — Telephone Encounter (Signed)
Noted  

## 2019-11-13 ENCOUNTER — Telehealth: Payer: Self-pay | Admitting: Gastroenterology

## 2019-11-13 NOTE — Telephone Encounter (Signed)
Requested prior TCS records from Dr. West Carbo as patient reported TCS with Dr. West Carbo 15 years ago. They do not have any records for this patient. This was just for our records. No changes in our current plans. We are planning to pursue TCS in the near future pending OV/clearance with hematology.

## 2019-12-22 ENCOUNTER — Telehealth: Payer: Self-pay

## 2019-12-22 NOTE — Telephone Encounter (Signed)
Pt called and asked when he should f/u. Pt said he had his apt at [facility] and blood work was completed. Pt said those records were suppose to be faxed to our office. Please advise on f/u.

## 2019-12-23 NOTE — Telephone Encounter (Signed)
Received instructions for medications needed prior to procedures. Tranexamic acid 2 tablets p.o. every 8 hours start night before procedure. Continue tranexamic acid 2 tablets p.o. every 8 hours x5 days if polypectomy, biopsies, or dilation is performed.   I have called the [facility] and left a message to clarify with Dr. Gaylyn Cheers the timing that patient needs to start Tranexamic acid the night before his procedure. Additionally, there is some discrepancy in how long patient needs to continue tranexamic acid after any intervention. Fax received states 5 days. Reviewed OV note which states it will be needed 7 days. Once information is clarified, we should be able to get him scheduled for procedures.   Also, looks like Dr. Gaylyn Cheers has already provided patient with a Rx for Tranexamic acid 2 tablets p.o. TID for upcoming procedures.   Candace Cruise. If we do not hear from Dr. Lynann Beaver office by middle of next week, please let me know.

## 2019-12-27 NOTE — Telephone Encounter (Signed)
Received a VM from [facility] [phone] option 2. Tranexamin Acid can be taking 2 tabs q8hours the night before pts procedure or if pt is Npo, pt can take q8 hrs x 5 days after his procedure. They were also going to call pt and go over instructions so pt would be aware of how to take medication.

## 2019-12-28 NOTE — Telephone Encounter (Signed)
I received fax from Glenard Haring, RN-BC clarifying Dr. Lynann Beaver instructions.   Patient is to start tranexamic acid 2 tablets by mouth at bedtime the night before the procedure. Okay to skip am dose prior to procedure if he is NPO. He will continue Tranexamic acid every 8 hours x7 days if procedures, polypectomy, biopsies, or dilation are performed.   Spoke with patient. He is aware of these instructions and will already have prescription.   [facility] Clinical Pool: Please arrange TCS/EGD +/- dilation with Dr. Gala Romney. Dx: Dysphagia, colon cancer screening, family history of colon cancer.  See above instructions regarding Tranexamic acid.  Take 1/2 dose metformin (500 mg) day prior to procedure.  Do not take any diabetes medications the morning of the procedure.

## 2019-12-30 NOTE — Telephone Encounter (Signed)
Called pt, no answer  [place] please advise ASA thanks

## 2019-12-30 NOTE — Telephone Encounter (Signed)
ASA III 

## 2020-01-03 NOTE — Telephone Encounter (Signed)
Tried to call pt to schedule procedure, no answer, LMOVM for return call. Letter mailed.

## 2020-01-19 ENCOUNTER — Telehealth: Payer: Self-pay | Admitting: Emergency Medicine

## 2020-01-19 NOTE — Telephone Encounter (Signed)
Called and informed pt we will call to schedule procedure when September schedule is available.

## 2020-01-19 NOTE — Telephone Encounter (Signed)
Pt called and stated he is returning phone call to schedule procedure [phone]

## 2020-02-09 NOTE — Telephone Encounter (Signed)
Ok to have propofol. However, he is still ASA III.

## 2020-02-09 NOTE — Telephone Encounter (Signed)
LMOVM to call back 

## 2020-02-09 NOTE — Telephone Encounter (Signed)
LMOVM

## 2020-02-09 NOTE — Telephone Encounter (Signed)
Patient called back to schedule procedure. He is needing to do his procedure on a Thursday due to his work schedule and would need a covid test prior. In order to schedule on Thursday he would need to be done with propofol, ASA 1 OR 2 and not conscious sedation. Please advise Cyril Mourning thanks

## 2020-02-10 NOTE — Telephone Encounter (Signed)
LMOVM for pt 

## 2020-02-13 ENCOUNTER — Encounter: Payer: Self-pay | Admitting: *Deleted

## 2020-02-13 NOTE — Telephone Encounter (Signed)
Letter mailed to pt.  

## 2020-02-13 NOTE — Telephone Encounter (Signed)
LMOVM for pt 

## 2020-02-14 NOTE — Telephone Encounter (Signed)
Patient called back. No dates worked for him. He is aware will call with November schedule

## 2020-02-28 NOTE — Telephone Encounter (Signed)
Patient has been scheduled for 11/8 at 9:15am. Instructions mailed with pre-op/covid testing appt.

## 2020-02-29 ENCOUNTER — Encounter: Payer: Self-pay | Admitting: *Deleted

## 2020-03-01 ENCOUNTER — Telehealth: Payer: Self-pay | Admitting: *Deleted

## 2020-03-01 NOTE — Telephone Encounter (Signed)
Called [employer] and was advised no PA is required for TCS/EGD/ED. Ref# [id number]

## 2020-04-10 ENCOUNTER — Other Ambulatory Visit (HOSPITAL_COMMUNITY): Payer: BC Managed Care – PPO

## 2020-04-23 HISTORY — PX: REPLACEMENT TOTAL KNEE: SUR1224

## 2020-04-26 NOTE — Patient Instructions (Signed)
Paul Curry  04/26/2020     @PREFPERIOPPHARMACY @   Your procedure is scheduled on  04/30/2020.  Report to [facility] at  [place].M.  Call this number if you have problems the morning of surgery:  [phone]   Remember:  Follow the diet and prep instructions given to you by the office.                      Take these medicines the morning of surgery with A SIP OF WATER  Amlodipine, prilosec, paxil.    Do not wear jewelry, make-up or nail polish.  Do not wear lotions, powders, or perfumes. Please wear deodorant and brush your teeth.  Do not shave 48 hours prior to surgery.  Men may shave face and neck.  Do not bring valuables to the hospital.  [facility] is not responsible for any belongings or valuables.  Contacts, dentures or bridgework may not be worn into surgery.  Leave your suitcase in the car.  After surgery it may be brought to your room.  For patients admitted to the hospital, discharge time will be determined by your treatment team.  Patients discharged the day of surgery will not be allowed to drive home.   Name and phone number of your driver:   Family   Special instructions:   DO NOT smoke the morning of your procedure.  Please read over the following fact sheets that you were given. Anesthesia Post-op Instructions and Care and Recovery After Surgery       Upper Endoscopy, Adult, Care After This sheet gives you information about how to care for yourself after your procedure. Your health care provider may also give you more specific instructions. If you have problems or questions, contact your health care provider. What can I expect after the procedure? After the procedure, it is common to have:  A sore throat.  Mild stomach pain or discomfort.  Bloating.  Nausea. Follow these instructions at home:   Follow instructions from your health care provider about what to eat or drink after your procedure.  Return to your normal activities  as told by your health care provider. Ask your health care provider what activities are safe for you.  Take over-the-counter and prescription medicines only as told by your health care provider.  Do not drive for 24 hours if you were given a sedative during your procedure.  Keep all follow-up visits as told by your health care provider. This is important. Contact a health care provider if you have:  A sore throat that lasts longer than one day.  Trouble swallowing. Get help right away if:  You vomit blood or your vomit looks like coffee grounds.  You have: ? A fever. ? Bloody, black, or tarry stools. ? A severe sore throat or you cannot swallow. ? Difficulty breathing. ? Severe pain in your chest or abdomen. Summary  After the procedure, it is common to have a sore throat, mild stomach discomfort, bloating, and nausea.  Do not drive for 24 hours if you were given a sedative during the procedure.  Follow instructions from your health care provider about what to eat or drink after your procedure.  Return to your normal activities as told by your health care provider. This information is not intended to replace advice given to you by your health care provider. Make sure you discuss any questions you have with your  health care provider. Document Revised: 12/01/2017 Document Reviewed: 11/09/2017 Elsevier Patient Education  [place].  Esophageal Dilatation Esophageal dilatation, also called esophageal dilation, is a procedure to widen or open (dilate) a blocked or narrowed part of the esophagus. The esophagus is the part of the body that moves food and liquid from the mouth to the stomach. You may need this procedure if:  You have a buildup of scar tissue in your esophagus that makes it difficult, painful, or impossible to swallow. This can be caused by gastroesophageal reflux disease (GERD).  You have cancer of the esophagus.  There is a problem with how food moves  through your esophagus. In some cases, you may need this procedure repeated at a later time to dilate the esophagus gradually. Tell a health care provider about:  Any allergies you have.  All medicines you are taking, including vitamins, herbs, eye drops, creams, and over-the-counter medicines.  Any problems you or family members have had with anesthetic medicines.  Any blood disorders you have.  Any surgeries you have had.  Any medical conditions you have.  Any antibiotic medicines you are required to take before dental procedures.  Whether you are pregnant or may be pregnant. What are the risks? Generally, this is a safe procedure. However, problems may occur, including:  Bleeding due to a tear in the lining of the esophagus.  A hole (perforation) in the esophagus. What happens before the procedure?  Follow instructions from your health care provider about eating or drinking restrictions.  Ask your health care provider about changing or stopping your regular medicines. This is especially important if you are taking diabetes medicines or blood thinners.  Plan to have someone take you home from the hospital or clinic.  Plan to have a responsible adult care for you for at least 24 hours after you leave the hospital or clinic. This is important. What happens during the procedure?  You may be given a medicine to help you relax (sedative).  A numbing medicine may be sprayed into the back of your throat, or you may gargle the medicine.  Your health care provider may perform the dilatation using various surgical instruments, such as: ? Simple dilators. This instrument is carefully placed in the esophagus to stretch it. ? Guided wire bougies. This involves using an endoscope to insert a wire into the esophagus. A dilator is passed over this wire to enlarge the esophagus. Then the wire is removed. ? Balloon dilators. An endoscope with a small balloon at the end is inserted into the  esophagus. The balloon is inflated to stretch the esophagus and open it up. The procedure may vary among health care providers and hospitals. What happens after the procedure?  Your blood pressure, heart rate, breathing rate, and blood oxygen level will be monitored until the medicines you were given have worn off.  Your throat may feel slightly sore and numb. This will improve slowly over time.  You will not be allowed to eat or drink until your throat is no longer numb.  When you are able to drink, urinate, and sit on the edge of the bed without nausea or dizziness, you may be able to return home. Follow these instructions at home:  Take over-the-counter and prescription medicines only as told by your health care provider.  Do not drive for 24 hours if you were given a sedative during your procedure.  You should have a responsible adult with you for 24 hours after the  procedure.  Follow instructions from your health care provider about any eating or drinking restrictions.  Do not use any products that contain nicotine or tobacco, such as cigarettes and e-cigarettes. If you need help quitting, ask your health care provider.  Keep all follow-up visits as told by your health care provider. This is important. Get help right away if you:  Have a fever.  Have chest pain.  Have pain that is not relieved by medication.  Have trouble breathing.  Have trouble swallowing.  Vomit blood. Summary  Esophageal dilatation, also called esophageal dilation, is a procedure to widen or open (dilate) a blocked or narrowed part of the esophagus.  Plan to have someone take you home from the hospital or clinic.  For this procedure, a numbing medicine may be sprayed into the back of your throat, or you may gargle the medicine.  Do not drive for 24 hours if you were given a sedative during your procedure. This information is not intended to replace advice given to you by your health care  provider. Make sure you discuss any questions you have with your health care provider. Document Revised: 04/06/2019 Document Reviewed: 04/14/2017 Elsevier Patient Education  [place].  Colonoscopy, Adult, Care After This sheet gives you information about how to care for yourself after your procedure. Your health care provider may also give you more specific instructions. If you have problems or questions, contact your health care provider. What can I expect after the procedure? After the procedure, it is common to have:  A small amount of blood in your stool for 24 hours after the procedure.  Some gas.  Mild cramping or bloating of your abdomen. Follow these instructions at home: Eating and drinking   Drink enough fluid to keep your urine pale yellow.  Follow instructions from your health care provider about eating or drinking restrictions.  Resume your normal diet as instructed by your health care provider. Avoid heavy or fried foods that are hard to digest. Activity  Rest as told by your health care provider.  Avoid sitting for a long time without moving. Get up to take short walks every 1-2 hours. This is important to improve blood flow and breathing. Ask for help if you feel weak or unsteady.  Return to your normal activities as told by your health care provider. Ask your health care provider what activities are safe for you. Managing cramping and bloating   Try walking around when you have cramps or feel bloated.  Apply heat to your abdomen as told by your health care provider. Use the heat source that your health care provider recommends, such as a moist heat pack or a heating pad. ? Place a towel between your skin and the heat source. ? Leave the heat on for 20-30 minutes. ? Remove the heat if your skin turns bright red. This is especially important if you are unable to feel pain, heat, or cold. You may have a greater risk of getting burned. General  instructions  For the first 24 hours after the procedure: ? Do not drive or use machinery. ? Do not sign important documents. ? Do not drink alcohol. ? Do your regular daily activities at a slower pace than normal. ? Eat soft foods that are easy to digest.  Take over-the-counter and prescription medicines only as told by your health care provider.  Keep all follow-up visits as told by your health care provider. This is important. Contact a health care provider  if:  You have blood in your stool 2-3 days after the procedure. Get help right away if you have:  More than a small spotting of blood in your stool.  Large blood clots in your stool.  Swelling of your abdomen.  Nausea or vomiting.  A fever.  Increasing pain in your abdomen that is not relieved with medicine. Summary  After the procedure, it is common to have a small amount of blood in your stool. You may also have mild cramping and bloating of your abdomen.  For the first 24 hours after the procedure, do not drive or use machinery, sign important documents, or drink alcohol.  Get help right away if you have a lot of blood in your stool, nausea or vomiting, a fever, or increased pain in your abdomen. This information is not intended to replace advice given to you by your health care provider. Make sure you discuss any questions you have with your health care provider. Document Revised: 01/03/2019 Document Reviewed: 01/03/2019 Elsevier Patient Education  [place] After These instructions provide you with information about caring for yourself after your procedure. Your health care provider may also give you more specific instructions. Your treatment has been planned according to current medical practices, but problems sometimes occur. Call your health care provider if you have any problems or questions after your procedure. What can I expect after the procedure? After your  procedure, you may:  Feel sleepy for several hours.  Feel clumsy and have poor balance for several hours.  Feel forgetful about what happened after the procedure.  Have poor judgment for several hours.  Feel nauseous or vomit.  Have a sore throat if you had a breathing tube during the procedure. Follow these instructions at home: For at least 24 hours after the procedure:      Have a responsible adult stay with you. It is important to have someone help care for you until you are awake and alert.  Rest as needed.  Do not: ? Participate in activities in which you could fall or become injured. ? Drive. ? Use heavy machinery. ? Drink alcohol. ? Take sleeping pills or medicines that cause drowsiness. ? Make important decisions or sign legal documents. ? Take care of children on your own. Eating and drinking  Follow the diet that is recommended by your health care provider.  If you vomit, drink water, juice, or soup when you can drink without vomiting.  Make sure you have little or no nausea before eating solid foods. General instructions  Take over-the-counter and prescription medicines only as told by your health care provider.  If you have sleep apnea, surgery and certain medicines can increase your risk for breathing problems. Follow instructions from your health care provider about wearing your sleep device: ? Anytime you are sleeping, including during daytime naps. ? While taking prescription pain medicines, sleeping medicines, or medicines that make you drowsy.  If you smoke, do not smoke without supervision.  Keep all follow-up visits as told by your health care provider. This is important. Contact a health care provider if:  You keep feeling nauseous or you keep vomiting.  You feel light-headed.  You develop a rash.  You have a fever. Get help right away if:  You have trouble breathing. Summary  For several hours after your procedure, you may feel  sleepy and have poor judgment.  Have a responsible adult stay with you for at least 24 hours  or until you are awake and alert. This information is not intended to replace advice given to you by your health care provider. Make sure you discuss any questions you have with your health care provider. Document Revised: 09/07/2017 Document Reviewed: 09/30/2015 Elsevier Patient Education  [place].

## 2020-04-27 ENCOUNTER — Other Ambulatory Visit: Payer: Self-pay

## 2020-04-27 ENCOUNTER — Other Ambulatory Visit (HOSPITAL_COMMUNITY)
Admission: RE | Admit: 2020-04-27 | Discharge: 2020-04-27 | Disposition: A | Discharge status: Home / Self Care | Payer: BC Managed Care – PPO | Source: Ambulatory Visit | Attending: Internal Medicine | Admitting: Internal Medicine

## 2020-04-27 ENCOUNTER — Encounter (HOSPITAL_COMMUNITY): Payer: Self-pay

## 2020-04-27 ENCOUNTER — Encounter (HOSPITAL_COMMUNITY)
Admission: RE | Admit: 2020-04-27 | Discharge: 2020-04-27 | Disposition: A | Discharge status: Home / Self Care | Payer: BC Managed Care – PPO | Source: Ambulatory Visit | Attending: Internal Medicine | Admitting: Internal Medicine

## 2020-04-27 DIAGNOSIS — K219 Gastro-esophageal reflux disease without esophagitis: Secondary | ICD-10-CM | POA: Diagnosis not present

## 2020-04-27 DIAGNOSIS — Z79899 Other long term (current) drug therapy: Secondary | ICD-10-CM | POA: Diagnosis not present

## 2020-04-27 DIAGNOSIS — Z1211 Encounter for screening for malignant neoplasm of colon: Secondary | ICD-10-CM | POA: Diagnosis not present

## 2020-04-27 DIAGNOSIS — Z800 Family history of malignant neoplasm of digestive organs: Secondary | ICD-10-CM | POA: Diagnosis not present

## 2020-04-27 DIAGNOSIS — Z01818 Encounter for other preprocedural examination: Secondary | ICD-10-CM | POA: Insufficient documentation

## 2020-04-27 DIAGNOSIS — D680 Von Willebrand's disease: Secondary | ICD-10-CM | POA: Diagnosis not present

## 2020-04-27 DIAGNOSIS — Z85828 Personal history of other malignant neoplasm of skin: Secondary | ICD-10-CM | POA: Diagnosis not present

## 2020-04-27 DIAGNOSIS — Z7984 Long term (current) use of oral hypoglycemic drugs: Secondary | ICD-10-CM | POA: Diagnosis not present

## 2020-04-27 DIAGNOSIS — K5730 Diverticulosis of large intestine without perforation or abscess without bleeding: Secondary | ICD-10-CM | POA: Diagnosis not present

## 2020-04-27 DIAGNOSIS — K635 Polyp of colon: Secondary | ICD-10-CM | POA: Diagnosis not present

## 2020-04-27 DIAGNOSIS — D120 Benign neoplasm of cecum: Secondary | ICD-10-CM | POA: Diagnosis not present

## 2020-04-27 DIAGNOSIS — R1314 Dysphagia, pharyngoesophageal phase: Secondary | ICD-10-CM | POA: Diagnosis not present

## 2020-04-27 DIAGNOSIS — Z20822 Contact with and (suspected) exposure to covid-19: Secondary | ICD-10-CM | POA: Insufficient documentation

## 2020-04-27 HISTORY — DX: Sleep apnea, unspecified: G47.30

## 2020-04-27 HISTORY — DX: Other specified postprocedural states: Z98.890

## 2020-04-27 HISTORY — DX: Nausea with vomiting, unspecified: R11.2

## 2020-04-27 HISTORY — DX: Other complications of anesthesia, initial encounter: T88.59XA

## 2020-04-28 LAB — SARS CORONAVIRUS 2 (TAT 6-24 HRS): SARS Coronavirus 2: NEGATIVE

## 2020-04-30 ENCOUNTER — Ambulatory Visit (HOSPITAL_COMMUNITY): Payer: BC Managed Care – PPO | Admitting: Anesthesiology

## 2020-04-30 ENCOUNTER — Other Ambulatory Visit: Payer: Self-pay

## 2020-04-30 ENCOUNTER — Ambulatory Visit (HOSPITAL_COMMUNITY)
Admission: RE | Admit: 2020-04-30 | Discharge: 2020-04-30 | Disposition: A | Discharge status: Home / Self Care | Payer: BC Managed Care – PPO | Attending: Internal Medicine | Admitting: Internal Medicine

## 2020-04-30 ENCOUNTER — Encounter (HOSPITAL_COMMUNITY): Payer: Self-pay | Admitting: Internal Medicine

## 2020-04-30 ENCOUNTER — Encounter (HOSPITAL_COMMUNITY)
Admission: RE | Disposition: A | Discharge status: Home / Self Care | Payer: Self-pay | Source: Home / Self Care | Attending: Internal Medicine

## 2020-04-30 DIAGNOSIS — R1310 Dysphagia, unspecified: Secondary | ICD-10-CM

## 2020-04-30 DIAGNOSIS — Z1211 Encounter for screening for malignant neoplasm of colon: Secondary | ICD-10-CM | POA: Diagnosis not present

## 2020-04-30 DIAGNOSIS — Z7984 Long term (current) use of oral hypoglycemic drugs: Secondary | ICD-10-CM | POA: Insufficient documentation

## 2020-04-30 DIAGNOSIS — D680 Von Willebrand's disease: Secondary | ICD-10-CM | POA: Insufficient documentation

## 2020-04-30 DIAGNOSIS — Z85828 Personal history of other malignant neoplasm of skin: Secondary | ICD-10-CM | POA: Insufficient documentation

## 2020-04-30 DIAGNOSIS — K5730 Diverticulosis of large intestine without perforation or abscess without bleeding: Secondary | ICD-10-CM | POA: Insufficient documentation

## 2020-04-30 DIAGNOSIS — Z800 Family history of malignant neoplasm of digestive organs: Secondary | ICD-10-CM | POA: Insufficient documentation

## 2020-04-30 DIAGNOSIS — R1314 Dysphagia, pharyngoesophageal phase: Secondary | ICD-10-CM | POA: Insufficient documentation

## 2020-04-30 DIAGNOSIS — Z79899 Other long term (current) drug therapy: Secondary | ICD-10-CM | POA: Insufficient documentation

## 2020-04-30 DIAGNOSIS — K219 Gastro-esophageal reflux disease without esophagitis: Secondary | ICD-10-CM | POA: Insufficient documentation

## 2020-04-30 DIAGNOSIS — K635 Polyp of colon: Secondary | ICD-10-CM | POA: Insufficient documentation

## 2020-04-30 DIAGNOSIS — D120 Benign neoplasm of cecum: Secondary | ICD-10-CM | POA: Insufficient documentation

## 2020-04-30 DIAGNOSIS — Z20822 Contact with and (suspected) exposure to covid-19: Secondary | ICD-10-CM | POA: Insufficient documentation

## 2020-04-30 HISTORY — PX: COLONOSCOPY WITH PROPOFOL: SHX5780

## 2020-04-30 HISTORY — PX: POLYPECTOMY: SHX5525

## 2020-04-30 HISTORY — PX: ESOPHAGOGASTRODUODENOSCOPY (EGD) WITH PROPOFOL: SHX5813

## 2020-04-30 HISTORY — PX: BIOPSY: SHX5522

## 2020-04-30 HISTORY — PX: MALONEY DILATION: SHX5535

## 2020-04-30 LAB — GLUCOSE, CAPILLARY: Glucose-Capillary: 131 mg/dL — ABNORMAL HIGH (ref 70–99)

## 2020-04-30 SURGERY — COLONOSCOPY WITH PROPOFOL
Anesthesia: General

## 2020-04-30 MED ORDER — GLYCOPYRROLATE 0.2 MG/ML IJ SOLN
INTRAMUSCULAR | Status: AC
  Filled 2020-04-30: qty 1

## 2020-04-30 MED ORDER — LIDOCAINE VISCOUS HCL 2 % MT SOLN
OROMUCOSAL | Status: AC
  Filled 2020-04-30: qty 15

## 2020-04-30 MED ORDER — LIDOCAINE VISCOUS HCL 2 % MT SOLN
15.0000 mL | Freq: Once | OROMUCOSAL | Status: AC
  Administered 2020-04-30: 15 mL via OROMUCOSAL

## 2020-04-30 MED ORDER — CHLORHEXIDINE GLUCONATE CLOTH 2 % EX PADS: 6.0000 | MEDICATED_PAD | Freq: Once | CUTANEOUS | Status: DC

## 2020-04-30 MED ORDER — LACTATED RINGERS IV SOLN: INTRAVENOUS | Status: DC | PRN

## 2020-04-30 MED ORDER — PROPOFOL 500 MG/50ML IV EMUL
INTRAVENOUS | Status: DC | PRN
  Administered 2020-04-30: 150 ug/kg/min via INTRAVENOUS

## 2020-04-30 MED ORDER — LACTATED RINGERS IV SOLN: Freq: Once | INTRAVENOUS | Status: AC

## 2020-04-30 MED ORDER — GLYCOPYRROLATE 0.2 MG/ML IJ SOLN
0.2000 mg | Freq: Once | INTRAMUSCULAR | Status: AC
  Administered 2020-04-30: 0.2 mg via INTRAVENOUS

## 2020-04-30 NOTE — Anesthesia Postprocedure Evaluation (Signed)
Anesthesia Post Note  Patient: Paul Curry  Procedure(s) Performed: COLONOSCOPY WITH PROPOFOL (N/A ) ESOPHAGOGASTRODUODENOSCOPY (EGD) WITH PROPOFOL (N/A ) MALONEY DILATION (N/A ) BIOPSY POLYPECTOMY  Patient location during evaluation: Phase II Anesthesia Type: General Level of consciousness: awake and alert and oriented Pain management: pain level controlled Vital Signs Assessment: post-procedure vital signs reviewed and stable Respiratory status: nonlabored ventilation, respiratory function stable and spontaneous breathing Cardiovascular status: blood pressure returned to baseline and stable Postop Assessment: no apparent nausea or vomiting Anesthetic complications: no   No complications documented.   Last Vitals:  Vitals:   04/30/20 1000 04/30/20 1007  BP: (!) 133/94 (!) 138/91  Pulse: 78 63  Resp: 17   Temp:  36.5 C  SpO2: 98% 99%    Last Pain:  Vitals:   04/30/20 1007  TempSrc: Oral  PainSc: 0-No pain                 Orlie Dakin

## 2020-04-30 NOTE — Op Note (Signed)
[facility] Patient Name: Paul Curry Procedure Date: 04/30/2020 9:26 AM MRN: [id number] Date of Birth: 1970/10/10 Attending MD: Norvel Richards , MD CSN: [id number] Age: 49 Admit Type: Outpatient Procedure:                Colonoscopy Indications:              Screening in patient at increased risk: Family                            history of 1st-degree relative with colorectal                            cancer before age 40 years Providers:                Norvel Richards, MD, Janeece Riggers, RN, Aram Candela Referring MD:              Medicines:                Propofol per Anesthesia Complications:            No immediate complications. Estimated Blood Loss:     Estimated blood loss was minimal. Procedure:                Pre-Anesthesia Assessment:                           - Prior to the procedure, a History and Physical                            was performed, and patient medications and                            allergies were reviewed. The patient's tolerance of                            previous anesthesia was also reviewed. The risks                            and benefits of the procedure and the sedation                            options and risks were discussed with the patient.                            All questions were answered, and informed consent                            was obtained. Prior Anticoagulants: The patient has                            taken no previous anticoagulant or antiplatelet  agents. ASA Grade Assessment: III - A patient with                            severe systemic disease. After reviewing the risks                            and benefits, the patient was deemed in                            satisfactory condition to undergo the procedure.                           After obtaining informed consent, the colonoscope                            was passed under direct vision.  Throughout the                            procedure, the patient's blood pressure, pulse, and                            oxygen saturations were monitored continuously. The                            CF-[id number] ([id number]) scope was introduced through                            the anus and advanced to the the cecum, identified                            by appendiceal orifice and ileocecal valve. The                            colonoscopy was performed without difficulty. The                            patient tolerated the procedure well. The quality                            of the bowel preparation was adequate. The                            ileocecal valve, appendiceal orifice, and rectum                            were photographed. The entire colon was well                            visualized. Scope In: 9:29:24 AM Scope Out: 9:43:29 AM Scope Withdrawal Time: 0 hours 10 minutes 35 seconds  Total Procedure Duration: 0 hours 14 minutes 5 seconds  Findings:      The perianal and digital rectal examinations were normal.      Two sessile polyps were found in the descending colon and  cecum. The       polyps were 4 to 6 mm in size. These polyps were removed with a cold       snare. Resection and retrieval were complete. Estimated blood loss was       minimal.      A few small-mouthed diverticula were found in the sigmoid colon and       descending colon.      The exam was otherwise without abnormality on direct and retroflexion       views. Impression:               - Two 4 to 6 mm polyps in the descending colon and                            in the cecum, removed with a cold snare. Resected                            and retrieved.                           - Diverticulosis in the sigmoid colon and in the                            descending colon.                           - The examination was otherwise normal on direct                            and retroflexion views. Moderate  Sedation:      Moderate (conscious) sedation was personally administered by an       anesthesia professional. The following parameters were monitored: oxygen       saturation, heart rate, blood pressure, respiratory rate, EKG, adequacy       of pulmonary ventilation, and response to care. Recommendation:           - Patient has a contact number available for                            emergencies. The signs and symptoms of potential                            delayed complications were discussed with the                            patient. Return to normal activities tomorrow.                            Written discharge instructions were provided to the                            patient.                           - Advance diet as tolerated.                           -  Repeat colonoscopy date to be determined after                            pending pathology results are reviewed for                            surveillance.                           - Return to GI office in 6 weeks. See EGD report.                            Continue tranexamic acid for 7 more days Procedure Code(s):        --- Professional ---                           [phone], Colonoscopy, flexible; with removal of                            tumor(s), polyp(s), or other lesion(s) by snare                            technique Diagnosis Code(s):        --- Professional ---                           Z80.0, Family history of malignant neoplasm of                            digestive organs                           K63.5, Polyp of colon                           K57.30, Diverticulosis of large intestine without                            perforation or abscess without bleeding CPT copyright 2019 American Medical Association. All rights reserved. The codes documented in this report are preliminary and upon coder review may  be revised to meet current compliance requirements. Cristopher Estimable. Aasim Restivo, MD Norvel Richards,  MD 04/30/2020 10:03:18 AM This report has been signed electronically. Number of Addenda: 0

## 2020-04-30 NOTE — Transfer of Care (Signed)
Immediate Anesthesia Transfer of Care Note  Patient: Paul Curry  Procedure(s) Performed: COLONOSCOPY WITH PROPOFOL (N/A ) ESOPHAGOGASTRODUODENOSCOPY (EGD) WITH PROPOFOL (N/A ) MALONEY DILATION (N/A ) BIOPSY POLYPECTOMY  Patient Location: PACU  Anesthesia Type:General  Level of Consciousness: awake, alert  and oriented  Airway & Oxygen Therapy: Patient Spontanous Breathing  Post-op Assessment: Report given to RN, Post -op Vital signs reviewed and stable and Patient moving all extremities X 4  Post vital signs: Reviewed and stable  Last Vitals:  Vitals Value Taken Time  BP 126/79 04/30/20 0947  Temp    Pulse 84 04/30/20 0949  Resp 16 04/30/20 0949  SpO2 99 % 04/30/20 0949  Vitals shown include unvalidated device data.  Last Pain:  Vitals:   04/30/20 0807  TempSrc: Oral  PainSc: 6          Complications: No complications documented.

## 2020-04-30 NOTE — Anesthesia Preprocedure Evaluation (Signed)
Anesthesia Evaluation  Patient identified by MRN, date of birth, ID band Patient awake    Reviewed: Allergy & Precautions, NPO status , Patient's Chart, lab work & pertinent test results  History of Anesthesia Complications (+) PONV, DIFFICULT AIRWAY and history of anesthetic complications  Airway Mallampati: II  TM Distance: >3 FB Neck ROM: Full    Dental  (+) Dental Advisory Given, Teeth Intact   Pulmonary sleep apnea and Continuous Positive Airway Pressure Ventilation ,    Pulmonary exam normal breath sounds clear to auscultation       Cardiovascular Exercise Tolerance: Good hypertension, Pt. on medications Normal cardiovascular exam Rhythm:Regular Rate:Normal     Neuro/Psych negative neurological ROS  negative psych ROS   GI/Hepatic negative GI ROS, Neg liver ROS,   Endo/Other  diabetes, Well Controlled, Type 2  Renal/GU      Musculoskeletal   Abdominal   Peds  Hematology  (+) Blood dyscrasia (von WILLIEBRAND Disease), ,   Anesthesia Other Findings   Reproductive/Obstetrics                             Anesthesia Physical Anesthesia Plan  ASA: III  Anesthesia Plan: General   Post-op Pain Management:    Induction: Intravenous  PONV Risk Score and Plan: TIVA  Airway Management Planned: Nasal Cannula and Natural Airway  Additional Equipment:   Intra-op Plan:   Post-operative Plan:   Informed Consent: I have reviewed the patients History and Physical, chart, labs and discussed the procedure including the risks, benefits and alternatives for the proposed anesthesia with the patient or authorized representative who has indicated his/her understanding and acceptance.     Dental advisory given  Plan Discussed with: CRNA and Surgeon  Anesthesia Plan Comments:         Anesthesia Quick Evaluation

## 2020-04-30 NOTE — H&P (Signed)
@LOGO @   Primary Care Physician:  System, Provider Not In Primary Gastroenterologist:  Dr. Gala Romney  Pre-Procedure History & Physical: HPI:  Paul Curry is a 49 y.o. male here for further evaluation of esophageal dysphagia background of chronic GERD-well-controlled on omeprazole daily.  Also here for high rescreening colonoscopy (mother with colon cancer diagnosed in her 57s).  No bowel symptoms currently.  Past Medical History:  Diagnosis Date  . 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 Clotting disorder ([place])   . Complication of anesthesia   . Diabetes ([place])   . Hypertension   . Knee joint pain 03/29/2013  . PONV (postoperative nausea and vomiting)   . 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 Skin cancer   . Sleep apnea   . Von Willebrand disease ([place]) 03/29/2013    Past Surgical History:  Procedure Laterality Date  . KNEE ARTHROSCOPY Left 2010  . TONSILLECTOMY Bilateral 2000    Prior to Admission medications   Medication Sig Start Date End Date Taking? Authorizing Provider  acetaminophen (TYLENOL) 325 MG tablet Take 650 mg by mouth every 6 (six) hours as needed for pain.   Yes [provider]  amLODipine (NORVASC) 10 MG tablet Take 10 mg by mouth daily.    Yes [provider]  metFORMIN (GLUCOPHAGE-XR) 500 MG 24 hr tablet Take 1,000 mg by mouth at bedtime. 02/01/20  Yes [provider]  montelukast (SINGULAIR) 10 MG tablet Take 10 mg by mouth at bedtime.   Yes [provider]  Multiple Vitamin (MULTIVITAMIN WITH MINERALS) TABS tablet Take 1 tablet by mouth daily.   Yes [provider]  omeprazole (PRILOSEC) 40 MG capsule Take 40 mg by mouth daily.    Yes [provider]  PARoxetine (PAXIL) 20 MG tablet Take 20 mg by mouth daily.  03/04/13  Yes [provider]  terazosin (HYTRIN) 2 MG capsule Take 2 mg by mouth at bedtime. 07/30/19  Yes [provider]  tranexamic acid (LYSTEDA) 650 MG TABS tablet Take 1,300 mg by  mouth See admin instructions. Take 1300 mg by mouth the day before procedure and 1300 daily for 8 days after procedure   Yes [provider]  valsartan-hydrochlorothiazide (DIOVAN-HCT) 320-25 MG tablet Take 1 tablet by mouth daily.    Yes [provider]  metFORMIN (GLUCOPHAGE) 1000 MG tablet Take 1,000 mg by mouth at bedtime. Patient not taking: Reported on 04/24/2020    [provider]    Allergies as of 02/28/2020  . (No Known Allergies)    Family History  Problem Relation Age of Onset  . Clotting disorder Mother        possibly, not diagnosed  . Colon cancer Mother        diagnosed in her 39s  . Clotting disorder Maternal Grandmother        possibly had it, not formally diagnosed  . Colon cancer Maternal Grandmother        diagnosed in her 78s    Social History   Socioeconomic History  . Marital status: Married    Spouse name: Not on file  . Number of children: Not on file  . Years of education: Not on file  . Highest education level: Not on file  Occupational History  . Not on file  Tobacco Use  . Smoking status: Never Smoker  . Smokeless tobacco: Never Used  Substance and Sexual Activity  . Alcohol use: Yes    Alcohol/week: 1.0 standard drink    Types:  1 Glasses of wine per week    Comment: couple beer a week.   . Drug use: No  . Sexual activity: Not on file  Other Topics Concern  . Not on file  Social History Narrative  . Not on file   Social Determinants of Health   Financial Resource Strain:   . Difficulty of Paying Living Expenses: Not on file  Food Insecurity:   . Worried About Charity fundraiser in the Last Year: Not on file  . Ran Out of Food in the Last Year: Not on file  Transportation Needs:   . Lack of Transportation (Medical): Not on file  . Lack of Transportation (Non-Medical): Not on file  Physical Activity:   . Days of Exercise per Week: Not on file  . Minutes of Exercise per Session: Not on file  Stress:   .  Feeling of Stress : Not on file  Social Connections:   . Frequency of Communication with Friends and Family: Not on file  . Frequency of Social Gatherings with Friends and Family: Not on file  . Attends Religious Services: Not on file  . Active Member of Clubs or Organizations: Not on file  . Attends Archivist Meetings: Not on file  . Marital Status: Not on file  Intimate Partner Violence:   . Fear of Current or Ex-Partner: Not on file  . Emotionally Abused: Not on file  . Physically Abused: Not on file  . Sexually Abused: Not on file    Review of Systems: See HPI, otherwise negative ROS  Physical Exam: BP (!) 142/94   Pulse 72   Temp 97.9 F (36.6 C) (Oral)   Resp 17   Ht 5\' 11"  (1.803 m)   Wt 123.4 kg   SpO2 100%   BMI 37.94 kg/m  General:   Alert,  Well-developed, well-nourished, pleasant and cooperative in NAD Neck:  Supple; no masses or thyromegaly. No significant cervical adenopathy. Lungs:  Clear throughout to auscultation.   No wheezes, crackles, or rhonchi. No acute distress. Heart:  Regular rate and rhythm; no murmurs, clicks, rubs,  or gallops. Abdomen: Non-distended, normal bowel sounds.  Soft and nontender without appreciable mass or hepatosplenomegaly.  Pulses:  Normal pulses noted. Extremities:  Without clubbing or edema.  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  Bleeding discussed with patient at length.  He understands the plan and will be specifically instructed following this procedure   I have offered the patient both an EGD with esophageal dilation and high risk  screening colonoscopy today.  I have discussed with  anesthesia.  The risks, benefits, limitations, imponderables and alternatives regarding both EGD and colonoscopy have been reviewed with the patient. Questions have been answered. All parties agreeable.      Notice: This dictation was prepared with Dragon dictation along with smaller phrase technology. Any transcriptional errors that result from this process are unintentional and may not be corrected upon review.

## 2020-04-30 NOTE — Op Note (Signed)
[facility] Patient Name: Paul Curry Procedure Date: 04/30/2020 8:52 AM MRN: [id number] Date of Birth: 12-Oct-1970 Attending MD: Norvel Richards , MD CSN: [id number] Age: 49 Admit Type: Outpatient Procedure:                Upper GI endoscopy Indications:              Dysphagia, GERD Providers:                Norvel Richards, MD, Janeece Riggers, RN, Aram Candela Referring MD:              Medicines:                Propofol per Anesthesia Complications:            No immediate complications. Estimated Blood Loss:     Estimated blood loss was minimal. Procedure:                After obtaining informed consent, the endoscope was                            passed under direct vision. Throughout the                            procedure, the patient's blood pressure, pulse, and                            oxygen saturations were monitored continuously. The                            GIF-H190 ([id number]) scope was introduced through the                            mouth, and advanced to the third part of duodenum.                            The upper GI endoscopy was accomplished without                            difficulty. The patient tolerated the procedure                            well. Scope In: 9:09:18 AM Scope Out: 9:25:02 AM Total Procedure Duration: 0 hours 15 minutes 44 seconds  Findings:      To 1.5 cm "tongues" of salmon-colored epithelium coming up above the GE       junction. The esophagus appeared otherwise normal and patent and without       its course. Polypoid gastric mucosa otherwise normal. Patent pylorus.      Scattered 1 to 2 mm white adherent plaques found diffusely covering the       second portion of the duodenum of uncertain significance. They would not       wash or rub off. Scope removed. I attempted to pass a 30 [place] Maloney       dilator but I could not  get it started beyond the hypopharynx. I backed       off and  obtained a 45 [place] Maloney dilator. I advanced it fully with       mild resistance. A look back revealed no apparent complication related       to this maneuver. Finally, biopsies of the distal esophagus and duodenum       taken. Impression:               -Abnormal distal esophagus?"query short segment                            Barrett's esophagus - status post biopsy after                            dilation                           -Abnormal duodenum of uncertain clinical                            significance?"status post biopsy Moderate Sedation:      Moderate (conscious) sedation was personally administered by an       anesthesia professional. The following parameters were monitored: oxygen       saturation, heart rate, blood pressure, respiratory rate, EKG, adequacy       of pulmonary ventilation, and response to care. Recommendation:           - Patient has a contact number available for                            emergencies. The signs and symptoms of potential                            delayed complications were discussed with the                            patient. Return to normal activities tomorrow.                            Written discharge instructions were provided to the                            patient.                           - Advance diet as tolerated. Continue omeprazole 40                            mg daily. Follow-up on pathology. Continue                            tranexamic acid x a total of 8 days as prescribed                            by hematology. Office visit in 6 weeks. See  colonoscopy report. Procedure Code(s):        --- Professional ---                           [phone], Esophagogastroduodenoscopy, flexible,                            transoral; diagnostic, including collection of                            specimen(s) by brushing or washing, when performed                            (separate  procedure) Diagnosis Code(s):        --- Professional ---                           R13.10, Dysphagia, unspecified CPT copyright 2019 [employer]. All rights reserved. The codes documented in this report are preliminary and upon coder review may  be revised to meet current compliance requirements. Cristopher Estimable. Mamadou Breon, MD Norvel Richards, MD 04/30/2020 9:59:37 AM This report has been signed electronically. Number of Addenda: 0

## 2020-04-30 NOTE — Discharge Instructions (Signed)
Colonoscopy Discharge Instructions  Read the instructions outlined below and refer to this sheet in the next few weeks. These discharge instructions provide you with general information on caring for yourself after you leave the hospital. Your doctor may also give you specific instructions. While your treatment has been planned according to the most current medical practices available, unavoidable complications occasionally occur. If you have any problems or questions after discharge, call Dr. Gala Romney at [phone]. ACTIVITY  You may resume your regular activity, but move at a slower pace for the next 24 hours.   Take frequent rest periods for the next 24 hours.   Walking will help get rid of the air and reduce the bloated feeling in your belly (abdomen).   No driving for 24 hours (because of the medicine (anesthesia) used during the test).    Do not sign any important legal documents or operate any machinery for 24 hours (because of the anesthesia used during the test).  NUTRITION  Drink plenty of fluids.   You may resume your normal diet as instructed by your doctor.   Begin with a light meal and progress to your normal diet. Heavy or fried foods are harder to digest and may make you feel sick to your stomach (nauseated).   Avoid alcoholic beverages for 24 hours or as instructed.  MEDICATIONS  You may resume your normal medications unless your doctor tells you otherwise.  WHAT YOU CAN EXPECT TODAY  Some feelings of bloating in the abdomen.   Passage of more gas than usual.   Spotting of blood in your stool or on the toilet paper.  IF YOU HAD POLYPS REMOVED DURING THE COLONOSCOPY:  No aspirin products for 7 days or as instructed.   No alcohol for 7 days or as instructed.   Eat a soft diet for the next 24 hours.  FINDING OUT THE RESULTS OF YOUR TEST Not all test results are available during your visit. If your test results are not back during the visit, make an appointment  with your caregiver to find out the results. Do not assume everything is normal if you have not heard from your caregiver or the medical facility. It is important for you to follow up on all of your test results.  SEEK IMMEDIATE MEDICAL ATTENTION IF:  You have more than a spotting of blood in your stool.   Your belly is swollen (abdominal distention).   You are nauseated or vomiting.   You have a temperature over 101.   You have abdominal pain or discomfort that is severe or gets worse throughout the day.   EGD Discharge instructions Please read the instructions outlined below and refer to this sheet in the next few weeks. These discharge instructions provide you with general information on caring for yourself after you leave the hospital. Your doctor may also give you specific instructions. While your treatment has been planned according to the most current medical practices available, unavoidable complications occasionally occur. If you have any problems or questions after discharge, please call your doctor. ACTIVITY  You may resume your regular activity but move at a slower pace for the next 24 hours.   Take frequent rest periods for the next 24 hours.   Walking will help expel (get rid of) the air and reduce the bloated feeling in your abdomen.   No driving for 24 hours (because of the anesthesia (medicine) used during the test).   You may shower.   Do not sign any  important legal documents or operate any machinery for 24 hours (because of the anesthesia used during the test).  NUTRITION  Drink plenty of fluids.   You may resume your normal diet.   Begin with a light meal and progress to your normal diet.   Avoid alcoholic beverages for 24 hours or as instructed by your caregiver.  MEDICATIONS  You may resume your normal medications unless your caregiver tells you otherwise.  WHAT YOU CAN EXPECT TODAY  You may experience abdominal discomfort such as a feeling of  fullness or "gas" pains.  FOLLOW-UP  Your doctor will discuss the results of your test with you.  SEEK IMMEDIATE MEDICAL ATTENTION IF ANY OF THE FOLLOWING OCCUR:  Excessive nausea (feeling sick to your stomach) and/or vomiting.   Severe abdominal pain and distention (swelling).   Trouble swallowing.   Temperature over 101 F (37.8 C).   Rectal bleeding or vomiting of blood.    Your esophagus was stretched today.  Samples of your esophagus and small intestine were taken  2 polyps removed from your colon.  You have mild diverticulosis  Office visit with Aliene Altes in 6 weeks  Continue omeprazole 40 mg daily  Continue tranexamic acid x7 days as instructed by your hematologist  At patient request, I called Renn Dirocco at [phone]  -left message on answering service.   Esophageal Dilatation Esophageal dilatation, also called esophageal dilation, is a procedure to widen or open (dilate) a blocked or narrowed part of the esophagus. The esophagus is the part of the body that moves food and liquid from the mouth to the stomach. You may need this procedure if:  You have a buildup of scar tissue in your esophagus that makes it difficult, painful, or impossible to swallow. This can be caused by gastroesophageal reflux disease (GERD).  You have cancer of the esophagus.  There is a problem with how food moves through your esophagus. In some cases, you may need this procedure repeated at a later time to dilate the esophagus gradually. Tell a health care provider about:  Any allergies you have.  All medicines you are taking, including vitamins, herbs, eye drops, creams, and over-the-counter medicines.  Any problems you or family members have had with anesthetic medicines.  Any blood disorders you have.  Any surgeries you have had.  Any medical conditions you have.  Any antibiotic medicines you are required to take before dental procedures.  Whether you are pregnant  or may be pregnant. What are the risks? Generally, this is a safe procedure. However, problems may occur, including:  Bleeding due to a tear in the lining of the esophagus.  A hole (perforation) in the esophagus. What happens before the procedure?  Follow instructions from your health care provider about eating or drinking restrictions.  Ask your health care provider about changing or stopping your regular medicines. This is especially important if you are taking diabetes medicines or blood thinners.  Plan to have someone take you home from the hospital or clinic.  Plan to have a responsible adult care for you for at least 24 hours after you leave the hospital or clinic. This is important. What happens during the procedure?  You may be given a medicine to help you relax (sedative).  A numbing medicine may be sprayed into the back of your throat, or you may gargle the medicine.  Your health care provider may perform the dilatation using various surgical instruments, such as: ? Simple dilators. This instrument is  carefully placed in the esophagus to stretch it. ? Guided wire bougies. This involves using an endoscope to insert a wire into the esophagus. A dilator is passed over this wire to enlarge the esophagus. Then the wire is removed. ? Balloon dilators. An endoscope with a small balloon at the end is inserted into the esophagus. The balloon is inflated to stretch the esophagus and open it up. The procedure may vary among health care providers and hospitals. What happens after the procedure?  Your blood pressure, heart rate, breathing rate, and blood oxygen level will be monitored until the medicines you were given have worn off.  Your throat may feel slightly sore and numb. This will improve slowly over time.  You will not be allowed to eat or drink until your throat is no longer numb.  When you are able to drink, urinate, and sit on the edge of the bed without nausea or  dizziness, you may be able to return home. Follow these instructions at home:  Take over-the-counter and prescription medicines only as told by your health care provider.  Do not drive for 24 hours if you were given a sedative during your procedure.  You should have a responsible adult with you for 24 hours after the procedure.  Follow instructions from your health care provider about any eating or drinking restrictions.  Do not use any products that contain nicotine or tobacco, such as cigarettes and e-cigarettes. If you need help quitting, ask your health care provider.  Keep all follow-up visits as told by your health care provider. This is important. Get help right away if you:  Have a fever.  Have chest pain.  Have pain that is not relieved by medication.  Have trouble breathing.  Have trouble swallowing.  Vomit blood. Summary  Esophageal dilatation, also called esophageal dilation, is a procedure to widen or open (dilate) a blocked or narrowed part of the esophagus.  Plan to have someone take you home from the hospital or clinic.  For this procedure, a numbing medicine may be sprayed into the back of your throat, or you may gargle the medicine.  Do not drive for 24 hours if you were given a sedative during your procedure. This information is not intended to replace advice given to you by your health care provider. Make sure you discuss any questions you have with your health care provider. Document Revised: 04/06/2019 Document Reviewed: 04/14/2017 Elsevier Patient Education  [place].   Colon Polyps  Polyps are tissue growths inside the body. Polyps can grow in many places, including the large intestine (colon). A polyp may be a round bump or a mushroom-shaped growth. You could have one polyp or several. Most colon polyps are noncancerous (benign). However, some colon polyps can become cancerous over time. Finding and removing the polyps early can help  prevent this. What are the causes? The exact cause of colon polyps is not known. What increases the risk? You are more likely to develop this condition if you:  Have a family history of colon cancer or colon polyps.  Are older than 38 or older than 45 if you are African American.  Have inflammatory bowel disease, such as ulcerative colitis or Crohn's disease.  Have certain hereditary conditions, such as: ? Familial adenomatous polyposis. ? Lynch syndrome. ? Turcot syndrome. ? Peutz-Jeghers syndrome.  Are overweight.  Smoke cigarettes.  Do not get enough exercise.  Drink too much alcohol.  Eat a diet that is high in  fat and red meat and low in fiber.  Had childhood cancer that was treated with abdominal radiation. What are the signs or symptoms? Most polyps do not cause symptoms. If you have symptoms, they may include:  Blood coming from your rectum when having a bowel movement.  Blood in your stool. The stool may look dark red or black.  Abdominal pain.  A change in bowel habits, such as constipation or diarrhea. How is this diagnosed? This condition is diagnosed with a colonoscopy. This is a procedure in which a lighted, flexible scope is inserted into the anus and then passed into the colon to examine the area. Polyps are sometimes found when a colonoscopy is done as part of routine cancer screening tests. How is this treated? Treatment for this condition involves removing any polyps that are found. Most polyps can be removed during a colonoscopy. Those polyps will then be tested for cancer. Additional treatment may be needed depending on the results of testing. Follow these instructions at home: Lifestyle  Maintain a healthy weight, or lose weight if recommended by your health care provider.  Exercise every day or as told by your health care provider.  Do not use any products that contain nicotine or tobacco, such as cigarettes and e-cigarettes. If you need help  quitting, ask your health care provider.  If you drink alcohol, limit how much you have: ? 0-1 drink a day for women. ? 0-2 drinks a day for men.  Be aware of how much alcohol is in your drink. In the [place] one drink equals one 12 oz bottle of beer (355 mL), one 5 oz glass of wine (148 mL), or one 1 oz shot of hard liquor (44 mL). Eating and drinking   Eat foods that are high in fiber, such as fruits, vegetables, and whole grains.  Eat foods that are high in calcium and vitamin D, such as milk, cheese, yogurt, eggs, liver, fish, and broccoli.  Limit foods that are high in fat, such as fried foods and desserts.  Limit the amount of red meat and processed meat you eat, such as hot dogs, sausage, bacon, and lunch meats. General instructions  Keep all follow-up visits as told by your health care provider. This is important. ? This includes having regularly scheduled colonoscopies. ? Talk to your health care provider about when you need a colonoscopy. Contact a health care provider if:  You have new or worsening bleeding during a bowel movement.  You have new or increased blood in your stool.  You have a change in bowel habits.  You lose weight for no known reason. Summary  Polyps are tissue growths inside the body. Polyps can grow in many places, including the colon.  Most colon polyps are noncancerous (benign), but some can become cancerous over time.  This condition is diagnosed with a colonoscopy.  Treatment for this condition involves removing any polyps that are found. Most polyps can be removed during a colonoscopy. This information is not intended to replace advice given to you by your health care provider. Make sure you discuss any questions you have with your health care provider. Document Revised: 09/24/2017 Document Reviewed: 09/24/2017 Elsevier Patient Education  [place].     Diverticulosis  Diverticulosis is a condition that develops when small  pouches (diverticula) form in the wall of the large intestine (colon). The colon is where water is absorbed and stool (feces) is formed. The pouches form when the inside layer of the  colon pushes through weak spots in the outer layers of the colon. You may have a few pouches or many of them. The pouches usually do not cause problems unless they become inflamed or infected. When this happens, the condition is called diverticulitis. What are the causes? The cause of this condition is not known. What increases the risk? The following factors may make you more likely to develop this condition:  Being older than age 61. Your risk for this condition increases with age. Diverticulosis is rare among people younger than age 89. By age 43, many people have it.  Eating a low-fiber diet.  Having frequent constipation.  Being overweight.  Not getting enough exercise.  Smoking.  Taking over-the-counter pain medicines, like aspirin and ibuprofen.  Having a family history of diverticulosis. What are the signs or symptoms? In most people, there are no symptoms of this condition. If you do have symptoms, they may include:  Bloating.  Cramps in the abdomen.  Constipation or diarrhea.  Pain in the lower left side of the abdomen. How is this diagnosed? Because diverticulosis usually has no symptoms, it is most often diagnosed during an exam for other colon problems. The condition may be diagnosed by:  Using a flexible scope to examine the colon (colonoscopy).  Taking an X-ray of the colon after dye has been put into the colon (barium enema).  Having a CT scan. How is this treated? You may not need treatment for this condition. Your health care provider may recommend treatment to prevent problems. You may need treatment if you have symptoms or if you previously had diverticulitis. Treatment may include:  Eating a high-fiber diet.  Taking a fiber supplement.  Taking a live bacteria supplement  (probiotic).  Taking medicine to relax your colon. Follow these instructions at home: Medicines  Take over-the-counter and prescription medicines only as told by your health care provider.  If told by your health care provider, take a fiber supplement or probiotic. Constipation prevention Your condition may cause constipation. To prevent or treat constipation, you may need to:  Drink enough fluid to keep your urine pale yellow.  Take over-the-counter or prescription medicines.  Eat foods that are high in fiber, such as beans, whole grains, and fresh fruits and vegetables.  Limit foods that are high in fat and processed sugars, such as fried or sweet foods.  General instructions  Try not to strain when you have a bowel movement.  Keep all follow-up visits as told by your health care provider. This is important. Contact a health care provider if you:  Have pain in your abdomen.  Have bloating.  Have cramps.  Have not had a bowel movement in 3 days. Get help right away if:  Your pain gets worse.  Your bloating becomes very bad.  You have a fever or chills, and your symptoms suddenly get worse.  You vomit.  You have bowel movements that are bloody or black.  You have bleeding from your rectum. Summary  Diverticulosis is a condition that develops when small pouches (diverticula) form in the wall of the large intestine (colon).  You may have a few pouches or many of them.  This condition is most often diagnosed during an exam for other colon problems.  Treatment may include increasing the fiber in your diet, taking supplements, or taking medicines. This information is not intended to replace advice given to you by your health care provider. Make sure you discuss any questions you have with your  health care provider. Document Revised: 01/06/2019 Document Reviewed: 01/06/2019 Elsevier Patient Education  [place].

## 2020-05-01 ENCOUNTER — Encounter: Payer: Self-pay | Admitting: Internal Medicine

## 2020-05-01 LAB — SURGICAL PATHOLOGY

## 2020-05-04 ENCOUNTER — Encounter (HOSPITAL_COMMUNITY): Payer: Self-pay | Admitting: Internal Medicine

## 2020-06-11 ENCOUNTER — Ambulatory Visit: Payer: BC Managed Care – PPO | Admitting: Gastroenterology

## 2020-07-23 NOTE — Progress Notes (Signed)
Referring Provider: No ref. provider found Primary Care Physician:  System, Provider Not In Primary GI Physician: Dr. Gala Romney  Chief Complaint  Patient presents with  . Dysphagia    Doing ok    HPI:   Paul Curry is a 50 y.o. male presenting today for follow-up s/p EGD and colonoscopy for colon cancer screening and dysphagia.   Patient was last seen in our office 10/17/2019 at the time of initial consult. No significant lower GI symptoms. Reported few year history of solid food dysphagia that has been worsening over the last year.  Felt solid items were getting stuck at the sternal notch requiring regurgitation.  Chronic GERD well controlled on omeprazole 40 mg daily.  Procedures 04/30/2020: EGD: Abnormal distal esophagus?  Query short segment Barrett's esophagus s/p dilation and biopsy, abnormal duodenum with scattered 1-2 mm white adherent plaques diffusely covering second portion of duodenum s/p biopsy.  Gastroesophageal mucosa with inflammation consistent with reflux, duodenal biopsies unremarkable. Colonoscopy: Two 4-6 mm polyps in the descending colon and cecum removed via cold snare, diverticulosis in the descending and sigmoid colon, otherwise normal exam.  Sessile serrated adenoma in the cecum and hyperplastic polyp in the descending colon.  Recommend repeat colonoscopy in 5 years.  Today:  Dysphagia has resolved. GERD well controlled on omeprazole 40 mg daily. No breakthrough symptoms unless eating [place] or [place]. No abdominal pain, nausea, vomiting, constipation, diarrhea, brbpr, melena, or unintentional weight loss. 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   Past Medical History:  Diagnosis Date  . Bleeding disorder ([place]) 04/12/2013  . Clotting disorder ([place])   . Complication of anesthesia   . Diabetes ([place])   . Hypertension   . Knee joint pain 03/29/2013  . PONV (postoperative nausea and vomiting)   . 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 Skin cancer   . Sleep apnea   . Von Willebrand disease ([place]) 03/29/2013    Past Surgical History:  Procedure Laterality Date  . BIOPSY  04/30/2020   Procedure: BIOPSY;  Surgeon: Daneil Dolin, MD;  Location: [facility];  Service: Endoscopy;;  . COLONOSCOPY WITH PROPOFOL N/A 04/30/2020    Surgeon: Daneil Dolin, MD;  Two 46 mm polyps in the descending colon and cecum, diverticulosis in the descending and sigmoid colon, otherwise normal exam.  Pathology with sessile serrated adenoma in the cecum and hyperplastic polyp in the descending colon.  Recommend repeat colonoscopy in 5 years.  . ESOPHAGOGASTRODUODENOSCOPY (EGD) WITH PROPOFOL N/A 04/30/2020   Surgeon: Daneil Dolin, MD; abnormal distal esophagus, query short segment Barrett's esophagus s/p dilation and biopsy (pathology consistent with reflux), abnormal duodenum with scattered 1-2 mm white adherent plaques diffusely covering second portion of duodenum s/p biopsy (pathology unremarkable).  Marland Kitchen KNEE ARTHROSCOPY Left 2010  . MALONEY DILATION N/A 04/30/2020   Procedure: Venia Minks DILATION;  Surgeon: Daneil Dolin, MD;  Location: [facility];  Service: Endoscopy;  Laterality: N/A;  . POLYPECTOMY  04/30/2020   Procedure: POLYPECTOMY;  Surgeon: Daneil Dolin, MD;  Location: [facility];  Service: Endoscopy;;  . 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 TONSILLECTOMY Bilateral 2000    Current Outpatient Medications  Medication Sig Dispense Refill  . acetaminophen (TYLENOL) 325 MG tablet Take 650 mg by mouth every 6 (six) hours as needed for pain.    Marland Kitchen amLODipine (NORVASC) 10 MG tablet Take 10 mg by mouth daily.     . metFORMIN (GLUCOPHAGE-XR)  500 MG 24 hr tablet Take 1,000 mg by mouth at bedtime.    . montelukast (SINGULAIR) 10 MG tablet Take 10 mg by mouth at bedtime.    . Multiple Vitamin (MULTIVITAMIN WITH MINERALS) TABS tablet Take 1 tablet by mouth daily.    Marland Kitchen omeprazole (PRILOSEC) 40 MG capsule Take 40 mg by mouth  daily.     Marland Kitchen PARoxetine (PAXIL) 20 MG tablet Take 20 mg by mouth daily.     Marland Kitchen terazosin (HYTRIN) 2 MG capsule Take 2 mg by mouth at bedtime.    . valsartan-hydrochlorothiazide (DIOVAN-HCT) 320-25 MG tablet Take 1 tablet by mouth daily.      No current facility-administered medications for this visit.    Allergies as of 07/25/2020 - Review Complete 07/25/2020  Allergen Reaction Noted  . Lisinopril Cough 12/07/2019    Family History  Problem Relation Age of Onset  . Clotting disorder Mother        possibly, not diagnosed  . Colon cancer Mother        diagnosed in her 40s  . Clotting disorder Maternal Grandmother        possibly had it, not formally diagnosed  . Colon cancer Maternal Grandmother        diagnosed in her 47s    Social History   Socioeconomic History  . Marital status: Married    Spouse name: Not on file  . Number of children: Not on file  . Years of education: Not on file  . Highest education level: Not on file  Occupational History  . Not on file  Tobacco Use  . Smoking status: Never Smoker  . Smokeless tobacco: Never Used  Substance and Sexual Activity  . Alcohol use: Yes    Alcohol/week: 1.0 standard drink    Types: 1 Glasses of wine per week    Comment: couple beer a week.   . Drug use: No  . Sexual activity: Not on file  Other Topics Concern  . Not on file  Social History Narrative  . Not on file   Social Determinants of Health   Financial Resource Strain: Not on file  Food Insecurity: Not on file  Transportation Needs: Not on file  Physical Activity: Not on file  Stress: Not on file  Social Connections: Not on file    Review of Systems: Gen: Denies fever, chills, cold or flu like symptoms, pre-syncope, or syncope CV: Denies chest pain or palpitations. Resp: Denies dyspnea at rest or cough.  GI: See HPI Heme: See HPI  Physical Exam: BP 137/88   Pulse 98   Temp (!) 97.3 F (36.3 C) (Temporal)   Ht 6' (1.829 m)   Wt 287 lb (130.2  kg)   BMI 38.92 kg/m  General:   Alert and oriented. No distress noted. Pleasant and cooperative.  Head:  Normocephalic and atraumatic. Eyes:  Conjuctiva clear without scleral icterus. Heart:  S1, S2 present without murmurs appreciated. Lungs:  Clear to auscultation bilaterally. No wheezes, rales, or rhonchi. No distress.  Abdomen:  +BS, soft, non-tender and non-distended. No rebound or guarding. No HSM or masses noted. Msk: Symmetrical without gross deformities. Normal posture. Extremities:  Without edema. Neurologic:  Alert and  oriented x4 Psych: Normal mood and affect.

## 2020-07-25 ENCOUNTER — Encounter: Payer: Self-pay | Admitting: Gastroenterology

## 2020-07-25 ENCOUNTER — Ambulatory Visit: Payer: BC Managed Care – PPO | Admitting: Gastroenterology

## 2020-07-25 ENCOUNTER — Other Ambulatory Visit: Payer: Self-pay

## 2020-07-25 VITALS — BP 137/88 | HR 98 | Temp 97.3°F | Ht 72.0 in | Wt 287.0 lb

## 2020-07-25 DIAGNOSIS — R1310 Dysphagia, unspecified: Secondary | ICD-10-CM | POA: Diagnosis not present

## 2020-07-25 DIAGNOSIS — Z86010 Personal history of colonic polyps: Secondary | ICD-10-CM | POA: Diagnosis not present

## 2020-07-25 NOTE — Assessment & Plan Note (Addendum)
50 year old male who recently underwent colonoscopy 04/30/2020 revealing 1 small sessile serrated adenoma and 1 small hyperplastic polyp.  He is due for repeat colonoscopy in 5 years.  He is currently on recall for this. No significant lower GI symptoms. No alarm symptoms. Plan to follow-up as needed.

## 2020-07-25 NOTE — Patient Instructions (Signed)
Continue taking omeprazole 40 mg daily 30 minutes before breakfast.   We will see you back as needed. Do not hesitate to call with any new GI concerns.   It was good to see you today!   Aliene Altes, PA-C [facility]

## 2020-07-25 NOTE — Assessment & Plan Note (Addendum)
Resolved s/p dilation. EGD 04/30/2020 with abnormal distal esophagus, query short segment Barrett's esophagus s/p dilation and biopsy, abnormal duodenum with scattered 1-2 mm white adherent plaques diffusely covering second portion of duodenum s/p biopsy.  GE junction biopsy with inflammation consistent with reflux, duodenal biopsies unremarkable. GERD remains well controlled on omeprazole 40 mg daily which has been managed by PCP.   Advised he continue omeprazole 40 mg daily and follow-up as needed.

## 2021-05-02 ENCOUNTER — Encounter: Payer: Self-pay | Admitting: Internal Medicine

## 2021-05-28 ENCOUNTER — Other Ambulatory Visit: Payer: Self-pay

## 2021-05-28 ENCOUNTER — Encounter: Payer: Self-pay | Admitting: Internal Medicine

## 2021-05-28 ENCOUNTER — Ambulatory Visit (INDEPENDENT_AMBULATORY_CARE_PROVIDER_SITE_OTHER): Payer: BC Managed Care – PPO | Admitting: Internal Medicine

## 2021-05-28 ENCOUNTER — Encounter: Payer: Self-pay | Admitting: *Deleted

## 2021-05-28 VITALS — BP 147/91 | HR 80 | Temp 97.7°F | Ht 72.0 in | Wt 306.8 lb

## 2021-05-28 DIAGNOSIS — K760 Fatty (change of) liver, not elsewhere classified: Secondary | ICD-10-CM | POA: Diagnosis not present

## 2021-05-28 DIAGNOSIS — R7989 Other specified abnormal findings of blood chemistry: Secondary | ICD-10-CM

## 2021-05-28 NOTE — Progress Notes (Signed)
Primary Care Physician:  System, Provider Not In Primary Gastroenterologist:  Dr. Gala Romney  Pre-Procedure History & Physical: HPI:  Paul Curry is a 50 y.o. male here f in referral from [facility] urology and nephrology in [place]. He is referred for a reported elevated LFTs.   .  As understanding, he presented to his urologist last month with hematuria with TNTC RBCs on urinalysis and urine dipstick positive for bilirubin.  There is a reported history of elevated liver function studies.  I do have blood work from [facility] dated 11/08/2020 which revealed a total bilirubin of 0.4 and AST of 37 ALT of 48 alkaline phosphatase was 79..  From Novembe ninth 2021 his AST and ALT were 31 and 45, respectively.  Total bilirubin 0.4 alkaline phosphatase 76  Hepatic ultrasound from 12/05/2020 [facility] imaging) demonstrated echogenic enlarged liver with hepatopetal flow through the portal vein.  This was a limited study.  No mention of spleen size.   Patient denies ever having yellow jaundice or hepatitis in the past.  He is given blood previously without a rejection letter.  Reviewing limited labs from [facility] and through [facility] health going back to 2014, the only abnormality on LFTs I can find is an AST 2 points above normal at 36 8 years ago.  I have seen this nice gentleman back in 2021 for dysphagia.  He underwent Maloney dilation of a Schatzki's ring.  There was no endoscopic stigmata of advanced liver disease at that time.  Incidentally, he also had serrated adenomas removed his colon last year and is due for a surveillance colonoscopy in 2026.  He is noted to be significantly obese associated comorbidities including diabetes, hypertension, sleep apnea and degenerative joint disease.  He also has von Willebrand's disease.  He does not consume alcohol or use herbal remedies.    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  Pathology with sessile serrated adenoma in the cecum and hyperplastic polyp in the descending colon.  Recommend repeat colonoscopy in 5 years.   ESOPHAGOGASTRODUODENOSCOPY (EGD) WITH PROPOFOL N/A 04/30/2020   Surgeon: Daneil Dolin, MD; abnormal distal esophagus, query short segment Barrett's esophagus s/p dilation and biopsy (pathology consistent with reflux), abnormal duodenum with scattered 1-2 mm white adherent plaques diffusely covering second portion of duodenum s/p biopsy (pathology unremarkable).   KNEE ARTHROSCOPY Left 2010   MALONEY DILATION N/A 04/30/2020   Procedure: Venia Minks DILATION;  Surgeon: Daneil Dolin, MD;  [facility];  Service: Endoscopy;  Laterality: N/A;   POLYPECTOMY  04/30/2020   Procedure: POLYPECTOMY;  Surgeon: Daneil Dolin, MD;  [facility];  Service: Endoscopy;;   REPLACEMENT TOTAL KNEE Right 04/2020   TONSILLECTOMY Bilateral 2000  Prior to Admission medications   Medication Sig Start Date End Date Taking? Authorizing Provider  acetaminophen (TYLENOL) 325 MG tablet Take 650 mg by mouth every 6 (six) hours as needed for pain.   Yes [provider]  amLODipine (NORVASC) 10 MG tablet Take 10 mg by mouth daily.    Yes [provider]  metFORMIN (GLUCOPHAGE-XR) 500 MG 24 hr tablet Take 1,000 mg by mouth at  bedtime. 02/01/20  Yes [provider]  metoprolol succinate (TOPROL-XL) 50 MG 24 hr tablet Take 50 mg by mouth daily. Take with or immediately following a meal.   Yes [provider]  montelukast (SINGULAIR) 10 MG tablet Take 10 mg by mouth at bedtime.   Yes [provider]  Multiple Vitamin (MULTIVITAMIN WITH MINERALS) TABS tablet Take 1 tablet by mouth daily.   Yes [provider]  omeprazole (PRILOSEC) 40 MG capsule Take 40 mg by mouth daily.    Yes [provider]  PARoxetine (PAXIL) 20 MG tablet Take 20 mg by mouth daily.  03/04/13  Yes [provider]  tamsulosin (FLOMAX) 0.4 MG CAPS capsule Take 0.4 mg by mouth daily.   Yes [provider]  terazosin (HYTRIN) 5 MG capsule Take 5 mg by mouth at bedtime. 07/30/19  Yes [provider]  valsartan-hydrochlorothiazide (DIOVAN-HCT) 320-25 MG tablet Take 1 tablet by mouth daily.    Yes [provider]    Allergies as of 05/28/2021 - Review Complete 05/28/2021  Allergen Reaction Noted   Lisinopril Cough 12/07/2019    Family History  Problem Relation Age of Onset   Clotting disorder Mother        possibly, not diagnosed   Colon cancer Mother        diagnosed in her 53s   Clotting disorder Maternal Grandmother        possibly had it, not formally diagnosed   Colon cancer Maternal Grandmother        diagnosed in her 5s    Social History   Socioeconomic History   Marital status: Married    Spouse name: Not on file   Number of children: Not on file   Years of education: Not on file   Highest education level: Not on file  Occupational History   Not on file  Tobacco Use   Smoking status: Never   Smokeless tobacco: Never  Substance and Sexual Activity   Alcohol use: Yes    Alcohol/week: 1.0 standard drink    Types: 1 Glasses of wine per week    Comment: couple beer a week.    Drug use: No   Sexual activity: Not on file  Other Topics Concern   Not on  file  Social History Narrative   Not on file   Social Determinants of Health   Financial Resource Strain: Not on file  Food Insecurity: Not on file  Transportation Needs: Not on file  Physical Activity: Not on file  Stress: Not on file  Social Connections: Not on file  Intimate Partner Violence: Not on file    Review of Systems: See HPI, otherwise negative ROS  Physical Exam: BP (!) 147/91   Pulse 80   Temp 97.7 F (36.5 C)   Ht 6' (1.829 m)   Wt (!) 306 lb 12.8 oz (139.2 kg)   BMI 41.61 kg/m  General:   Alert,  , well-nourished, pleasant and cooperative in NAD Neck:  Supple; no masses or thyromegaly. No significant cervical adenopathy. Lungs:  Clear throughout to auscultation.   No wheezes, crackles, or rhonchi. No acute distress. Heart:  Regular rate and rhythm; no murmurs, clicks, rubs,  or gallops. Abdomen: Obese.  Positive bowel sounds.  Liver edge indistinct.  Spleen is not ballotable.  Abdominal girth limits the physical examination.   Pulses:  Normal pulses noted. Extremities:  Without clubbing or edema.  Impression/Plan: Mr. Placido Hangartner is a pleasant obese 50 year old gentleman who basically has metabolic syndrome referred for elevated liver function studies.  With my review of the record, I note only a minimally elevated AST 8 years ago.   Recent bilirubin noted in the urine is entirely nonspecific in the setting of gross hematuria.  Recent LFTs have been repeatedly normal.  He does have an enlarged, fatty appearing liver on ultrasound.  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   However, based on my recent endoscopic evaluation, laboratory and clinical data, he does not have advanced  Chronic liver disease. However, he is at risk for developing more significant liver disease in the future based on NAFLD.  History of serrated colonic adenomas-removed previously; due for surveillance colonoscopy 2026  History of  GERD-well-controlled on omeprazole.  No further dysphagia since he had his esophagus stretched previously   Recommendations:  Had a lengthy discussion regarding his obesity and the association between not only fatty liver and multiple other comorbidities. The importance of losing weight and becoming more physically fit was emphasized today.  For now,  Weight loss would be of great benefit   Recommend 5% loss of body weight in the next 1 year (15 to 20 pounds)  Would avoid alcohol altogether is much as possible.  Aerobic exercise 30 minutes 3 times a week  Will obtain liver elastography for baseline  at [facility]  Chem-12, INR to be done soon  Will assess for chronic viral hepatitis and hepatitis A/B immune status.  Continue omeprazole 40 mg daily for reflux  Repeat colonoscopy in 2026 for surveillance purposes   Notice: This dictation was prepared with Dragon dictation along with smaller phrase technology. Any transcriptional errors that result from this process are unintentional and may not be corrected upon review.

## 2021-05-28 NOTE — Patient Instructions (Addendum)
It was good to see you again today!  It is reassuring to note that your blood enzymes coming from your liver are not elevated.  You do have a fatty liver on ultrasound.  Being overweight does increase your risk of having hepatitis related to fat in your liver.  For now,  Weight loss would be of great benefit for your health across the board  Recommend 5% loss of your body weight in the next 1 year (15 to 20 pounds)  Although I do not think your alcohol intake is excessive, you should consider decreasing it by 50% that we will help with weight loss and lessen the toxic effects of alcohol affecting your liver)  Aerobic exercise 30 minutes 3 times a week  Baseline liver elastography at [facility]  Chem-12, INR to be done soon  Hepatitis C antibody, hepatitis B surface antigen and surface antibody.  Hepatitis A antibody soon  Continue omeprazole 40 mg daily for reflux  Plan for repeat colonoscopy 2026  Office visit here in 6 months

## 2021-06-07 ENCOUNTER — Ambulatory Visit (HOSPITAL_COMMUNITY)
Admission: RE | Admit: 2021-06-07 | Discharge: 2021-06-07 | Disposition: A | Discharge status: Home / Self Care | Payer: BC Managed Care – PPO | Source: Ambulatory Visit | Attending: Internal Medicine | Admitting: Internal Medicine

## 2021-06-07 ENCOUNTER — Ambulatory Visit (HOSPITAL_COMMUNITY): Payer: BC Managed Care – PPO

## 2021-06-07 ENCOUNTER — Telehealth: Payer: Self-pay | Admitting: Internal Medicine

## 2021-06-07 ENCOUNTER — Other Ambulatory Visit: Payer: Self-pay

## 2021-06-07 DIAGNOSIS — K760 Fatty (change of) liver, not elsewhere classified: Secondary | ICD-10-CM | POA: Insufficient documentation

## 2021-06-07 NOTE — Telephone Encounter (Signed)
Grayland Ormond from [facility] called to get additional diagnosis codes on patient. I told her the nurse was on another call and she said she could call her back at [phone]

## 2021-06-07 NOTE — Telephone Encounter (Signed)
Spoke with [facility] and provided icd-10 code for ordered labs.

## 2021-06-10 LAB — COMPLETE METABOLIC PANEL WITH GFR
AG Ratio: 1.7 (calc) (ref 1.0–2.5)
ALT: 24 U/L (ref 9–46)
AST: 28 U/L (ref 10–35)
Albumin: 4 g/dL (ref 3.6–5.1)
Alkaline phosphatase (APISO): 60 U/L (ref 35–144)
BUN: 14 mg/dL (ref 7–25)
CO2: 30 mmol/L (ref 20–32)
Calcium: 8.8 mg/dL (ref 8.6–10.3)
Chloride: 101 mmol/L (ref 98–110)
Creat: 0.87 mg/dL (ref 0.70–1.30)
Globulin: 2.3 g/dL (calc) (ref 1.9–3.7)
Glucose, Bld: 102 mg/dL — ABNORMAL HIGH (ref 65–99)
Potassium: 3.8 mmol/L (ref 3.5–5.3)
Sodium: 140 mmol/L (ref 135–146)
Total Bilirubin: 0.5 mg/dL (ref 0.2–1.2)
Total Protein: 6.3 g/dL (ref 6.1–8.1)
eGFR: 104 mL/min/{1.73_m2} (ref >=60)

## 2021-06-10 LAB — HEPATITIS C ANTIBODY
Hepatitis C Ab: NONREACTIVE
SIGNAL TO CUT-OFF: <0.02 (ref <1.00)

## 2021-06-10 LAB — HEPATITIS B SURFACE ANTIBODY,QUALITATIVE: Hep B S Ab: NONREACTIVE

## 2021-06-10 LAB — PROTIME-INR
INR: 1
Prothrombin Time: 10.1 s (ref 9.0–11.5)

## 2021-06-10 LAB — HEPATITIS B SURFACE ANTIGEN: Hepatitis B Surface Ag: NONREACTIVE

## 2021-06-10 LAB — HEPATITIS A ANTIBODY, IGM: Hep A IgM: NONREACTIVE

## 2021-06-10 NOTE — Progress Notes (Signed)
Pt was made aware and verbalized understanding. Routing to the front for office visit in one year.

## 2021-10-01 ENCOUNTER — Ambulatory Visit: Payer: BC Managed Care – PPO | Admitting: Internal Medicine

## 2021-10-01 ENCOUNTER — Encounter: Payer: Self-pay | Admitting: Internal Medicine

## 2021-10-01 VITALS — BP 142/90 | HR 75 | Temp 98.0°F | Ht 71.0 in | Wt 316.2 lb

## 2021-10-01 DIAGNOSIS — K760 Fatty (change of) liver, not elsewhere classified: Secondary | ICD-10-CM | POA: Diagnosis not present

## 2021-10-01 DIAGNOSIS — K219 Gastro-esophageal reflux disease without esophagitis: Secondary | ICD-10-CM

## 2021-10-01 DIAGNOSIS — K645 Perianal venous thrombosis: Secondary | ICD-10-CM

## 2021-10-01 DIAGNOSIS — R7989 Other specified abnormal findings of blood chemistry: Secondary | ICD-10-CM | POA: Diagnosis not present

## 2021-10-01 DIAGNOSIS — Z86010 Personal history of colonic polyps: Secondary | ICD-10-CM

## 2021-10-01 NOTE — Patient Instructions (Addendum)
It was good to see you again today! ? ?As discussed, you have a resolving thrombosed hemorrhoid.  Nothing specific needs to be done.  It should go away completely on its own ? ?I do recommend daily fiber supplementation in a way of Benefiber 1 tablespoon.  Avoid straining. ? ?Continue omeprazole daily for GERD ? ?As discussed, losing weight and becoming more physically fit will help your liver as well as multiple other associated medical problems ? ?My goal for you is to lose 15 pounds in the next 6 months.  Regular aerobic exercise. ? ?Blood work in August of this year (Chem-12, CBC and INR) ? ?Repeat colonoscopy for surveillance 2026 ? ?Get hepatitis A and B vaccine as previously recommended ? ?Office visit with me in 6 months ? ?Information on GERD and thrombosed hemorrhoids provided. ?

## 2021-10-01 NOTE — Progress Notes (Signed)
? ? ?Primary Care Physician:  Pcp, No ?Primary Gastroenterologist:  Dr. Gala Romney ? ?Pre-Procedure History & Physical: ?HPI:  Paul Curry is a 51 y.o. male here for evalaution of a "knot to the right of his anus".  It came up 2 months ago.  It was throbbing.  It his improved to the point where it is almost gone now.  Contacted PCP who said come here.  No similar symptoms.  No drainage.  Bowel function 1 time daily.  No fever or chills.  Colonoscopy 2 years ago serrated polyp positive,  family history colon cancer; due for surveillance 2026. ? ?Reflux well controlled on omeprazole 40 mg daily.  Prior EGD-no Barrett's.  Empirically dilated.  Denies dysphagia. ? ?Unfortunately, he has gained another 10 pounds and remains significantly obese. ? ?History of mild transaminitis resolved.  Fatty liver on ultrasound elastography 9 kPa ? ? ?Past Medical History:  ?Diagnosis Date  ? Bleeding disorder ([place]) 04/12/2013  ? Clotting disorder ([place])   ? Complication of anesthesia   ? Diabetes ([place])   ? Hypertension   ? Knee joint pain 03/29/2013  ? PONV (postoperative nausea and vomiting)   ? 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 Skin cancer   ? Sleep apnea   ? Von Willebrand disease ([place]) 03/29/2013  ? ? ?Past Surgical History:  ?Procedure Laterality Date  ? BIOPSY  04/30/2020  ? Procedure: BIOPSY;  Surgeon: Daneil Dolin, MD;  [facility];  Service: Endoscopy;;  ? COLONOSCOPY WITH PROPOFOL N/A 04/30/2020  ?  Surgeon: Daneil Dolin, MD;  Two 46 mm polyps in the descending colon and cecum, diverticulosis in the descending and sigmoid colon, otherwise normal exam.  Pathology with sessile serrated adenoma in the cecum and hyperplastic polyp in the descending colon.  Recommend repeat colonoscopy in 5 years.  ? ESOPHAGOGASTRODUODENOSCOPY (EGD) WITH PROPOFOL N/A 04/30/2020  ? Surgeon: Daneil Dolin, MD; abnormal distal esophagus, query short segment Barrett's esophagus s/p dilation and biopsy (pathology consistent with  reflux), abnormal duodenum with scattered 1-2 mm white adherent plaques diffusely covering second portion of duodenum s/p biopsy (pathology unremarkable).  ? KNEE ARTHROSCOPY Left 2010  ? MALONEY DILATION N/A 04/30/2020  ? Procedure: MALONEY DILATION;  Surgeon: Daneil Dolin, MD;  [facility];  Service: Endoscopy;  Laterality: N/A;  ? POLYPECTOMY  04/30/2020  ? Procedure: POLYPECTOMY;  Surgeon: Daneil Dolin, MD;  [facility];  Service: Endoscopy;;  ? REPLACEMENT TOTAL KNEE Right 04/2020  ? TONSILLECTOMY Bilateral 2000  ? ? ?Prior to Admission medications   ?Medication Sig Start Date End Date Taking? Authorizing Provider  ?acetaminophen (TYLENOL) 325 MG tablet Take 650 mg by mouth every 6 (six) hours as needed for pain.   Yes [provider]  ?amLODipine (NORVASC) 10 MG tablet Take 10 mg by mouth daily.    Yes [provider]  ?metFORMIN (GLUCOPHAGE-XR) 500 MG 24 hr tablet Take 1,000 mg by mouth at bedtime. 02/01/20  Yes [provider]  ?metoprolol succinate (TOPROL-XL) 50 MG 24 hr tablet Take 50 mg by mouth daily. Take with or immediately following a meal.   Yes [provider]  ?montelukast (SINGULAIR) 10 MG tablet Take 10 mg by mouth at bedtime.   Yes [provider]  ?Multiple Vitamin (MULTIVITAMIN WITH MINERALS) TABS tablet Take 1 tablet by mouth daily.   Yes [provider]  ?omeprazole (PRILOSEC) 40 MG capsule Take 40 mg by mouth daily.  Yes [provider]  ?PARoxetine (PAXIL) 20 MG tablet Take 20 mg by mouth daily.  03/04/13  Yes [provider]  ?tamsulosin (FLOMAX) 0.4 MG CAPS capsule Take 0.4 mg by mouth daily.   Yes [provider]  ?terazosin (HYTRIN) 5 MG capsule Take 5 mg by mouth at bedtime. 07/30/19  Yes [provider]  ?valsartan-hydrochlorothiazide (DIOVAN-HCT) 320-25 MG tablet Take 1 tablet by mouth daily.    Yes [provider]  ? ? ?Allergies as of 10/01/2021 - Review  Complete 10/01/2021  ?Allergen Reaction Noted  ? Lisinopril Cough 12/07/2019  ? ? ?Family History  ?Problem Relation Age of Onset  ? Clotting disorder Mother   ?     possibly, not diagnosed  ? Colon cancer Mother   ?     diagnosed in her 13s  ? Clotting disorder Maternal Grandmother   ?     possibly had it, not formally diagnosed  ? Colon cancer Maternal Grandmother   ?     diagnosed in her 64s  ? ? ?Social History  ? ?Socioeconomic History  ? Marital status: Married  ?  Spouse name: Not on file  ? Number of children: Not on file  ? Years of education: Not on file  ? Highest education level: Not on file  ?Occupational History  ? Not on file  ?Tobacco Use  ? Smoking status: Never  ? Smokeless tobacco: Never  ?Substance and Sexual Activity  ? Alcohol use: Yes  ?  Alcohol/week: 1.0 standard drink  ?  Types: 1 Glasses of wine per week  ?  Comment: couple beer a week.   ? Drug use: No  ? Sexual activity: Yes  ?  Partners: Female  ?  Comment: spouse  ?Other Topics Concern  ? Not on file  ?Social History Narrative  ? Not on file  ? ?Social Determinants of Health  ? ?Financial Resource Strain: Not on file  ?Food Insecurity: Not on file  ?Transportation Needs: Not on file  ?Physical Activity: Not on file  ?Stress: Not on file  ?Social Connections: Not on file  ?Intimate Partner Violence: Not on file  ? ? ?Review of Systems: ?See HPI, otherwise negative ROS ? ?Physical Exam: ?BP (!) 142/90 (BP Location: Right Arm, Patient Position: Sitting, Cuff Size: Large)   Pulse 75   Temp 98 ?F (36.7 ?C) (Temporal)   Ht '5\' 11"'$  (1.803 m)   Wt (!) 316 lb 3.2 oz (143.4 kg)   SpO2 97%   BMI 44.10 kg/m?  ?General:   Alert,  Well-developed, well-nourished, pleasant and cooperative in NAD ?Abdomen: Non-distended, normal bowel sounds.  Soft and nontender without appreciable mass or hepatosplenomegaly.  ?Pulses:  Normal pulses noted. ?Extremities:  Without clubbing or edema. ?Perianal exam: Patient has a resolving thrombosed hemorrhoid at  the anal verge at the 3 o'clock position.  It is entirely nontender. ? ? ?Impression/Plan: Patient has recent history of thrombosed hemorrhoid which is resolving spontaneously.  No specific therapy needed at this time.  We talked about etiology and treatment depending on the time course. ? ?GERD well-controlled on omeprazole ? ?History of serrated polyps/family history of colon cancer; due for surveillance 2026 ? ?Fatty liver with history of transaminitis; follow-up labs normal. ? ?Recommendations: ? ? ?As discussed, you have a resolving thrombosed hemorrhoid.  Nothing specific needs to be done.  It should go away completely on its own ? ?I do recommend daily fiber supplementation in a way of Benefiber 1  tablespoon.  Avoid straining. ? ?Continue omeprazole daily for GERD ? ?As discussed, losing weight and becoming more physically fit will help your liver as well as multiple other associated medical problems ? ?My goal for you is to lose 15 pounds in the next 6 months.  Regular aerobic exercise. ? ?Blood work in August of this year (Chem-12, CBC and INR) ? ?Repeat colonoscopy for surveillance 2026 ? ?Get hepatitis A and B vaccine as previously recommended ? ?Office visit with me in 6 months ? ?Information on GERD and thrombosed hemorrhoids provided. ? ? ? ?Notice: This dictation was prepared with Dragon dictation along with smaller phrase technology. Any transcriptional errors that result from this process are unintentional and may not be corrected upon review.  ?

## 2021-11-14 ENCOUNTER — Encounter: Payer: Self-pay | Admitting: Internal Medicine

## 2022-01-20 ENCOUNTER — Other Ambulatory Visit: Payer: Self-pay

## 2022-01-20 DIAGNOSIS — K760 Fatty (change of) liver, not elsewhere classified: Secondary | ICD-10-CM

## 2022-01-20 DIAGNOSIS — R7989 Other specified abnormal findings of blood chemistry: Secondary | ICD-10-CM

## 2022-03-12 ENCOUNTER — Encounter: Payer: Self-pay | Admitting: *Deleted

## 2022-05-01 ENCOUNTER — Encounter: Payer: Self-pay | Admitting: Internal Medicine

## 2023-02-14 NOTE — Progress Notes (Unsigned)
Referring Provider: Romeo Rabon, MD Primary Care Physician:  Romeo Rabon, MD Primary GI Physician: Dr. Jena Gauss  Chief Complaint  Patient presents with   Diarrhea    Been going on for a little over a week    HPI:   Paul Curry is a 52 y.o. male with history of adenomatous colon polyp due for surveillance in 2026, GERD, fatty liver with kPa 11.28 May 2021, prior transaminitis that resolved, presenting today with chief complaint of diarrhea.  Today: Went to [place] week before last. [employer], to [employer], and water parks. Came home Thursday, then Friday, had the worse abdominal pain in LLQ and started having diarrhea. Abdominal pain has improved, but watery stool continues. Reports it smells bad. Has a lot of gas. Appetite is down. 6 BMs yesterday. Today has had 3 Bms today already. No family members with diarrhea. No brbpr or melena. No nocturnal stools. No new medications. Has lost a little weight. No nausea or vomiting. Just not eating as much as he does not want to have diarrhea.  Dicyclomine didn't help.   Prior to this, had normal Bms. Usually 1-2 solid BMs daily.  No family history of IBD.  Daughter with gluten allergy/celiac.  Past Medical History:  Diagnosis Date   Bleeding disorder (HCC) 04/12/2013   Clotting disorder (HCC)    Complication of anesthesia    Diabetes (HCC)    Hypertension    Knee joint pain 03/29/2013   PONV (postoperative nausea and vomiting)    Qualitative platelet defect (HCC)    Skin cancer    Sleep apnea    Von Willebrand disease (HCC) 03/29/2013    Past Surgical History:  Procedure Laterality Date   BIOPSY  04/30/2020   Procedure: BIOPSY;  Surgeon: Corbin Ade, MD;  [facility]: [facility];  Service: Endoscopy;;   COLONOSCOPY WITH PROPOFOL N/A 04/30/2020   Surgeon: Corbin Ade, MD;  Two 4-6 mm polyps in the descending colon and cecum, diverticulosis in the descending and sigmoid colon, otherwise normal  exam.  Pathology with sessile serrated adenoma in the cecum and hyperplastic polyp in the descending colon.  Recommend repeat colonoscopy in 5 years.   ESOPHAGOGASTRODUODENOSCOPY (EGD) WITH PROPOFOL N/A 04/30/2020   Surgeon: Corbin Ade, MD; abnormal distal esophagus, query short segment Barrett's esophagus s/p dilation and biopsy (pathology consistent with reflux), abnormal duodenum with scattered 1-2 mm white adherent plaques diffusely covering second portion of duodenum s/p biopsy (pathology unremarkable).   KNEE ARTHROSCOPY Left 2010   MALONEY DILATION N/A 04/30/2020   Procedure: Elease Hashimoto DILATION;  Surgeon: Corbin Ade, MD;  Location: [facility];  Service: Endoscopy;  Laterality: N/A;   POLYPECTOMY  04/30/2020   Procedure: POLYPECTOMY;  Surgeon: Corbin Ade, MD;  [facility]: [facility];  Service: Endoscopy;;   REPLACEMENT TOTAL KNEE Right 04/2020   TONSILLECTOMY Bilateral 2000    Current Outpatient Medications  Medication Sig Dispense Refill   acetaminophen (TYLENOL) 325 MG tablet Take 650 mg by mouth every 6 (six) hours as needed for pain.     furosemide (LASIX) 40 MG tablet Take 1 tablet by mouth every morning.     metoprolol succinate (TOPROL-XL) 50 MG 24 hr tablet Take 50 mg by mouth 2 (two) times daily. Take with or immediately following a meal.     montelukast (SINGULAIR) 10 MG tablet Take 10 mg by mouth at bedtime.     Multiple Vitamin (MULTIVITAMIN WITH MINERALS) TABS tablet Take 1 tablet  by mouth daily.     omeprazole (PRILOSEC) 40 MG capsule Take 40 mg by mouth daily.      PARoxetine (PAXIL) 20 MG tablet Take 20 mg by mouth daily.      pioglitazone (ACTOS) 15 MG tablet      Potassium Chloride ER 20 MEQ TBCR Take 1 tablet by mouth daily.     spironolactone (ALDACTONE) 25 MG tablet Take 1 tablet by mouth daily.     tamsulosin (FLOMAX) 0.4 MG CAPS capsule Take 0.4 mg by mouth daily.     terazosin (HYTRIN) 5 MG capsule Take 5 mg by mouth 2 (two) times daily.      valsartan-hydrochlorothiazide (DIOVAN-HCT) 320-25 MG tablet Take 1 tablet by mouth daily.      No current facility-administered medications for this visit.    Allergies as of 02/16/2023 - Review Complete 02/16/2023  Allergen Reaction Noted   Lisinopril Cough 12/07/2019   Metformin Diarrhea 02/16/2023    Family History  Problem Relation Age of Onset   Clotting disorder Mother        possibly, not diagnosed   Colon cancer Mother        diagnosed in her 71s   Clotting disorder Maternal Grandmother        possibly had it, not formally diagnosed   Colon cancer Maternal Grandmother        diagnosed in her 38s   Inflammatory bowel disease Neg Hx     Social History   Socioeconomic History   Marital status: Married    Spouse name: Not on file   Number of children: Not on file   Years of education: Not on file   Highest education level: Not on file  Occupational History   Not on file  Tobacco Use   Smoking status: Never   Smokeless tobacco: Never  Substance and Sexual Activity   Alcohol use: Yes    Alcohol/week: 1.0 standard drink of alcohol    Types: 1 Glasses of wine per week    Comment: couple beer a week.    Drug use: No   Sexual activity: Yes    Partners: Female    Comment: spouse  Other Topics Concern   Not on file  Social History Narrative   Not on file   Social Determinants of Health   Financial Resource Strain: Low Risk  (05/17/2020)   [facility]   Overall Financial Resource Strain (CARDIA)    Difficulty of Paying Living Expenses: Not hard at all  Food Insecurity: No Food Insecurity (05/17/2020)   Received from [facility], [facility]   Hunger Vital Sign    Worried About Running Out of Food in the Last Year: Never true    Ran Out of Food in the Last Year: Never true  Transportation Needs: No Transportation Needs (05/17/2020)   Received from [facility], [facility]   [facility] Transportation    Lack  of Transportation (Medical): No    Lack of Transportation (Non-Medical): No  Physical Activity: Not on file  Stress: Not on file  Social Connections: Not on file    Review of Systems: Gen: Denies fever, chills, cold or flulike symptoms, presyncope, syncope. CV: Denies chest pain, palpitations. Resp: Denies dyspnea, cough. GI: See HPI Heme: See HPI  Physical Exam: BP 135/89 (BP Location: Right Arm, Patient Position: Sitting, Cuff Size: Large)   Pulse 72   Temp 98.3 F (36.8 C) (Oral)  Ht 5\' 11"  (1.803 m)   Wt (!) 304 lb 9.6 oz (138.2 kg)   SpO2 98%   BMI 42.48 kg/m  General:   Alert and oriented. No distress noted. Pleasant and cooperative.  Head:  Normocephalic and atraumatic. Eyes:  Conjuctiva clear without scleral icterus. Heart:  S1, S2 present without murmurs appreciated. Lungs:  Clear to auscultation bilaterally. No wheezes, rales, or rhonchi. No distress.  Abdomen:  +BS, soft, and non-distended. Mild TTP in LLQ and RUQ.  No rebound or guarding. No HSM or masses noted. Msk:  Symmetrical without gross deformities. Normal posture. Extremities:  Without edema. Neurologic:  Alert and  oriented x4 Psych:  Normal mood and affect.    Assessment:  52 y.o. male with history of adenomatous colon polyp due for surveillance in 2026, GERD, fatty liver with kPa 11.28 May 2021, prior transaminitis that resolved, presenting today with chief complaint of acute change in bowel habits with diarrhea that started 10 days ago. Initially with significant LLQ abdominal pain, but this is improving. Continues with watery stools without brbpr or melena.  Reports recent travel to [place] the week prior to symptom onset where he went to amusement parks and white water rafting.  No recent medication changes, known sick contacts.  Trial of dicyclomine unhelpful. Colonoscopy up-to-date November 2021, due for surveillance in 2026.  No family history of IBD.   Considering acute change, suspect we  are dealing with infectious diarrhea. I will check CBC, CMP and stool studies. I will also screen for thyroid abnormalities, celiac disease, and check inflammatory markers to help rule out IBD.    Plan:  C. difficile GDH and toxin A/B, GI pathogen panel, Giardia, Cryptosporidium, IgA, TTG IgA, TSH, CRP, sed rate, CBC, CMP. Bland diet for now. Focus on maintaining adequate hydration with fluids that contain electrolytes. Further recommendations to follow.   Ermalinda Memos, PA-C [facility] 02/16/2023

## 2023-02-16 ENCOUNTER — Ambulatory Visit: Payer: BC Managed Care – PPO | Admitting: Gastroenterology

## 2023-02-16 ENCOUNTER — Encounter: Payer: Self-pay | Admitting: Gastroenterology

## 2023-02-16 VITALS — BP 135/89 | HR 72 | Temp 98.3°F | Ht 71.0 in | Wt 304.6 lb

## 2023-02-16 DIAGNOSIS — R1032 Left lower quadrant pain: Secondary | ICD-10-CM | POA: Diagnosis not present

## 2023-02-16 DIAGNOSIS — R197 Diarrhea, unspecified: Secondary | ICD-10-CM | POA: Diagnosis not present

## 2023-02-16 NOTE — Patient Instructions (Addendum)
Please have labs and stool studies completed at [facility].  Follow a bland diet for now.  Drink plenty of fluids that contain electrolytes such as Pedialyte, Gatorade, G2, etc.  We will call you with results and further recommendations.  Ermalinda Memos, PA-C [facility]

## 2023-02-18 ENCOUNTER — Other Ambulatory Visit: Payer: Self-pay | Admitting: Gastroenterology

## 2023-02-18 ENCOUNTER — Encounter: Payer: Self-pay | Admitting: *Deleted

## 2023-02-18 DIAGNOSIS — E876 Hypokalemia: Secondary | ICD-10-CM

## 2023-02-24 ENCOUNTER — Other Ambulatory Visit: Payer: Self-pay | Admitting: *Deleted

## 2023-02-24 DIAGNOSIS — R197 Diarrhea, unspecified: Secondary | ICD-10-CM

## 2023-02-25 ENCOUNTER — Other Ambulatory Visit: Payer: Self-pay | Admitting: *Deleted

## 2023-02-25 DIAGNOSIS — R197 Diarrhea, unspecified: Secondary | ICD-10-CM

## 2023-02-25 LAB — COMPLETE METABOLIC PANEL WITH GFR
AG Ratio: 2 (calc) (ref 1.0–2.5)
ALT: 23 U/L (ref 9–46)
AST: 28 U/L (ref 10–35)
Albumin: 4.3 g/dL (ref 3.6–5.1)
Alkaline phosphatase (APISO): 61 U/L (ref 35–144)
BUN: 20 mg/dL (ref 7–25)
CO2: 28 mmol/L (ref 20–32)
Calcium: 9.3 mg/dL (ref 8.6–10.3)
Chloride: 102 mmol/L (ref 98–110)
Creat: 1.17 mg/dL (ref 0.70–1.30)
Globulin: 2.2 g/dL (ref 1.9–3.7)
Glucose, Bld: 103 mg/dL — ABNORMAL HIGH (ref 65–99)
Potassium: 3.4 mmol/L — ABNORMAL LOW (ref 3.5–5.3)
Sodium: 139 mmol/L (ref 135–146)
Total Bilirubin: 0.5 mg/dL (ref 0.2–1.2)
Total Protein: 6.5 g/dL (ref 6.1–8.1)
eGFR: 74 mL/min/{1.73_m2} (ref >=60)

## 2023-02-25 LAB — CBC WITH DIFFERENTIAL/PLATELET
Absolute Monocytes: 359 {cells}/uL (ref 200–950)
Basophils Absolute: 69 cells/uL (ref 0–200)
Basophils Relative: 1.5 %
Eosinophils Absolute: 138 cells/uL (ref 15–500)
Eosinophils Relative: 3 %
HCT: 39.2 % (ref 38.5–50.0)
Hemoglobin: 13.2 g/dL (ref 13.2–17.1)
Lymphs Abs: 1021 {cells}/uL (ref 850–3900)
MCH: 29.1 pg (ref 27.0–33.0)
MCHC: 33.7 g/dL (ref 32.0–36.0)
MCV: 86.5 fL (ref 80.0–100.0)
MPV: 10.7 fL (ref 7.5–12.5)
Monocytes Relative: 7.8 %
Neutro Abs: 3013 cells/uL (ref 1500–7800)
Neutrophils Relative %: 65.5 %
Platelets: 173 10*3/uL (ref 140–400)
RBC: 4.53 10*6/uL (ref 4.20–5.80)
RDW: 13.4 % (ref 11.0–15.0)
Total Lymphocyte: 22.2 %
WBC: 4.6 10*3/uL (ref 3.8–10.8)

## 2023-02-25 LAB — GIARDIA ANTIGEN
MICRO NUMBER:: 15390635
RESULT:: NOT DETECTED
SPECIMEN QUALITY:: ADEQUATE

## 2023-02-25 LAB — C-REACTIVE PROTEIN: CRP: 6.5 mg/L (ref <8.0)

## 2023-02-25 LAB — CRYPTOSPORIDIUM ANTIGEN, EIA
Specimen Quality:: ADEQUATE
micro Number:: 15389468

## 2023-02-25 LAB — GASTROINTESTINAL PATHOGEN PNL

## 2023-02-25 LAB — TSH: TSH: 2.64 m[IU]/L (ref 0.40–4.50)

## 2023-02-25 LAB — C. DIFFICILE GDH AND TOXIN A/B
GDH ANTIGEN: NOT DETECTED
MICRO NUMBER:: 15392481
SPECIMEN QUALITY:: ADEQUATE
TOXIN A AND B: NOT DETECTED

## 2023-02-25 LAB — IGA: Immunoglobulin A: 159 mg/dL (ref 47–310)

## 2023-02-25 LAB — TISSUE TRANSGLUTAMINASE, IGA: (tTG) Ab, IgA: <1 U/mL

## 2023-02-25 LAB — SEDIMENTATION RATE: Sed Rate: 11 mm/h (ref 0–20)

## 2023-02-27 LAB — GASTROINTESTINAL PATHOGEN PNL
CampyloBacter Group: NOT DETECTED
Norovirus GI/GII: NOT DETECTED
Rotavirus A: NOT DETECTED
Salmonella species: NOT DETECTED
Shiga Toxin 1: NOT DETECTED
Shiga Toxin 2: NOT DETECTED
Shigella Species: NOT DETECTED
Vibrio Group: NOT DETECTED
Yersinia enterocolitica: NOT DETECTED

## 2023-03-02 ENCOUNTER — Other Ambulatory Visit: Payer: Self-pay | Admitting: Gastroenterology

## 2023-03-02 ENCOUNTER — Encounter: Payer: Self-pay | Admitting: *Deleted

## 2023-03-02 DIAGNOSIS — R143 Flatulence: Secondary | ICD-10-CM

## 2023-03-02 DIAGNOSIS — R197 Diarrhea, unspecified: Secondary | ICD-10-CM

## 2023-03-02 DIAGNOSIS — K580 Irritable bowel syndrome with diarrhea: Secondary | ICD-10-CM

## 2023-03-02 MED ORDER — RIFAXIMIN 550 MG PO TABS
550.0000 mg | ORAL_TABLET | Freq: Three times a day (TID) | ORAL | 0 refills | Status: AC
Start: 2023-03-02 — End: 2023-03-16

## 2023-03-02 NOTE — Telephone Encounter (Signed)
I have spoke with patient.  He continues to have diarrhea, flatulence, abdominal cramping related to bowel movements.  No fever.  I do not suspect he has Listeria.  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  I will plan to treat him with Xifaxan 550 mg 3 times daily x 14 days.  If he has persistent symptoms, will need to proceed with a colonoscopy for further evaluation.

## 2023-03-02 NOTE — Progress Notes (Signed)
Patient continues to have diarrhea, flatulence, abdominal cramping related to bowel movements.  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  I will plan to treat him with Xifaxan 550 mg 3 times daily x 14 days.  If he has persistent symptoms, will need to proceed with a colonoscopy for further evaluation.

## 2023-04-07 IMAGING — US US LIVER ELASTOGRAPHY
1 series · 12 of 25 positions shown · non-contrast
Comparison: None.

CLINICAL DATA: Elevated LFTs

EXAM:
US LIVER ELASTOGRAPHY
TECHNIQUE: Sonography of the liver was performed. In addition, ultrasound
elastography evaluation of the liver was performed. A region of
interest was placed within the right lobe of the liver. Following
application of a compressive sonographic pulse, tissue
compressibility was assessed. Multiple assessments were performed at
the selected site. Median tissue compressibility was determined.
Previously, hepatic stiffness was assessed by shear wave velocity.
Based on recently published Society of Radiologists in Ultrasound
consensus article, reporting is now recommended to be performed in
the SI units of pressure (kiloPascals) representing hepatic
stiffness/elasticity. The obtained result is compared to the
published reference standards. (cACLD = compensated Advanced Chronic
Liver Disease)

[Series 1: us elastography liver · 12 of 37 slices shown]
[im 2/37]
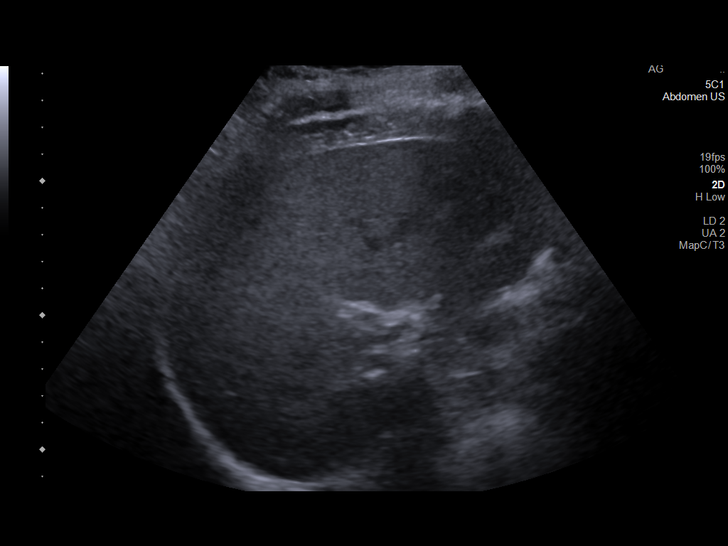
[im 5/37]
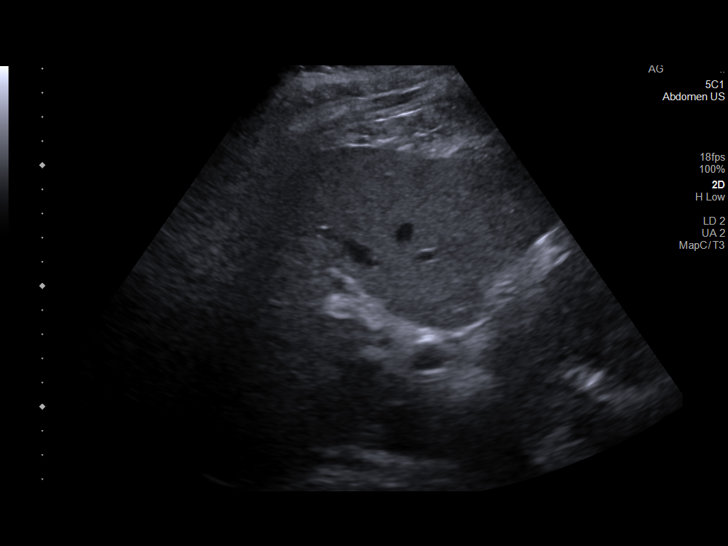
[im 8/37]
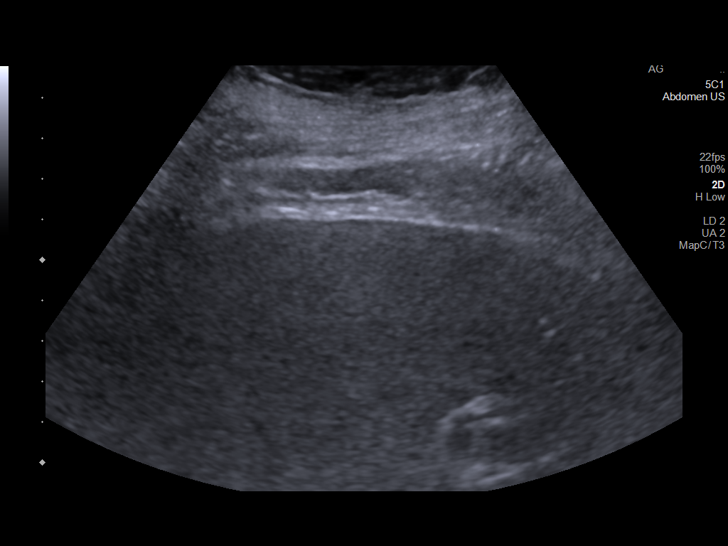
[im 11/37]
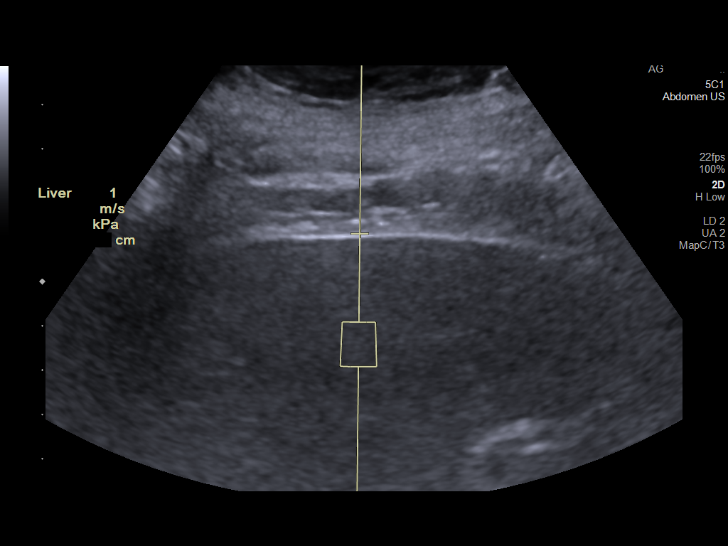
[im 14/37]
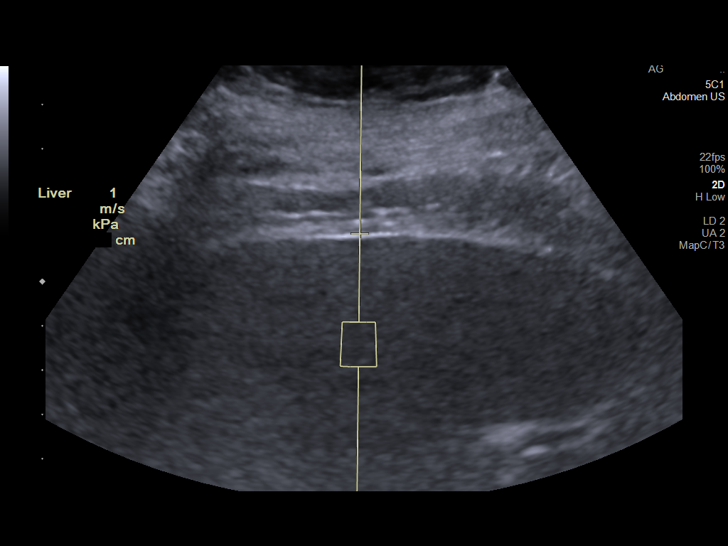
[im 17/37]
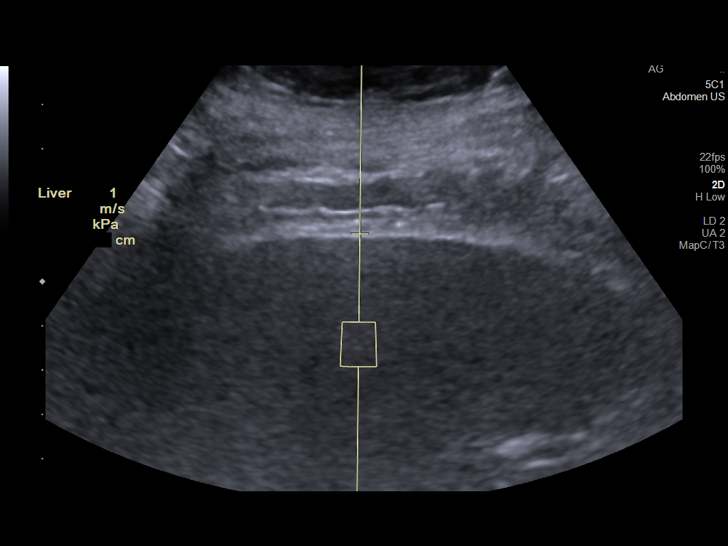
[im 20/37]
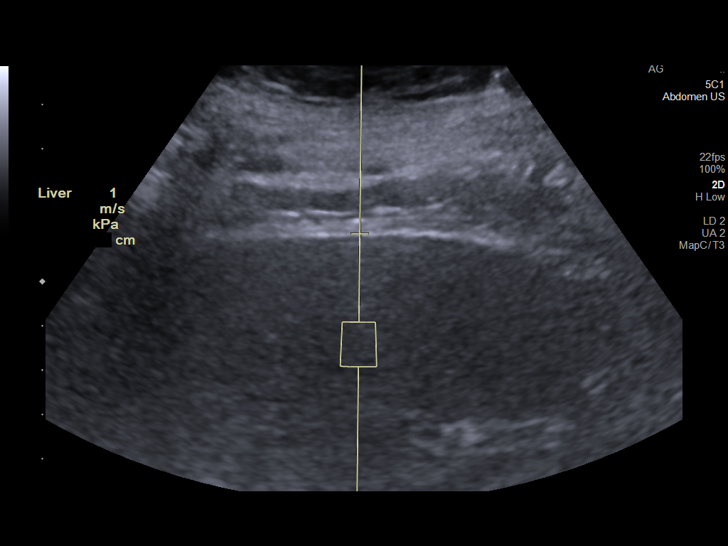
[im 23/37]
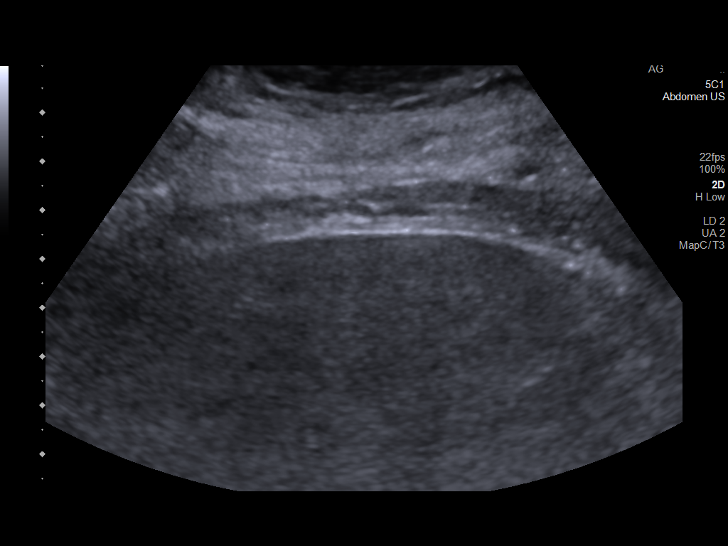
[im 26/37]
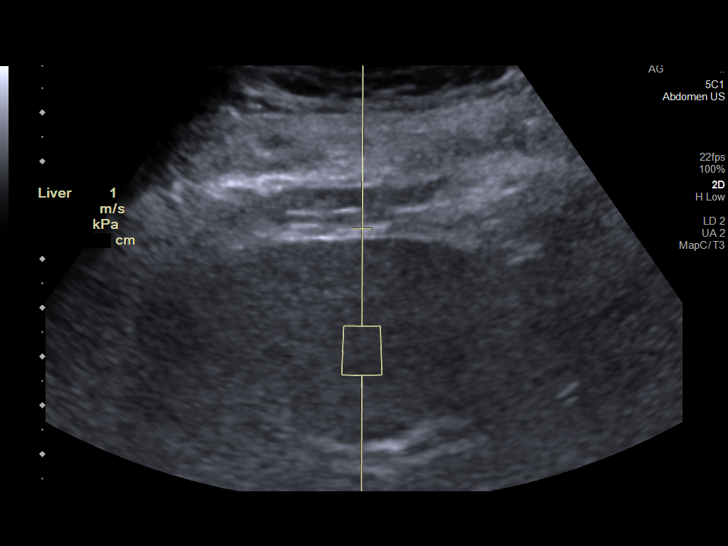
[im 29/37]
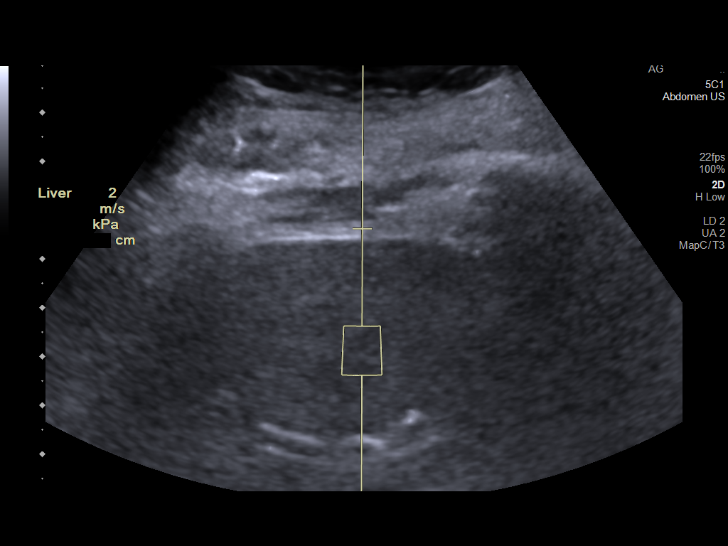
[im 32/37]
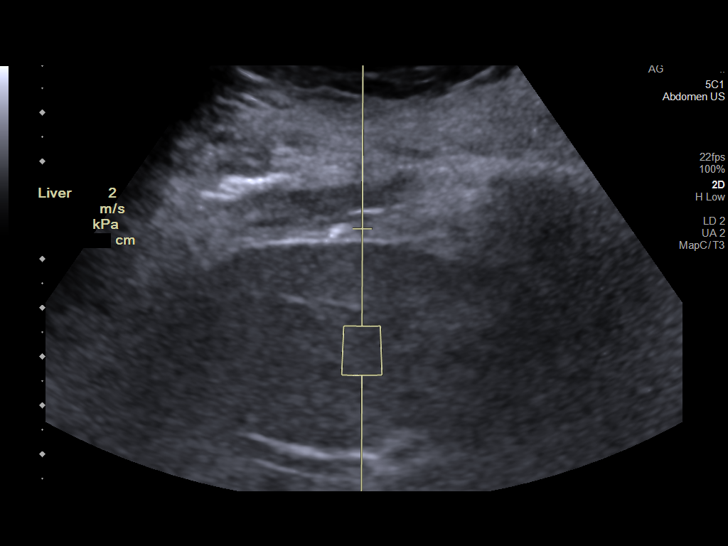
[im 35/37]
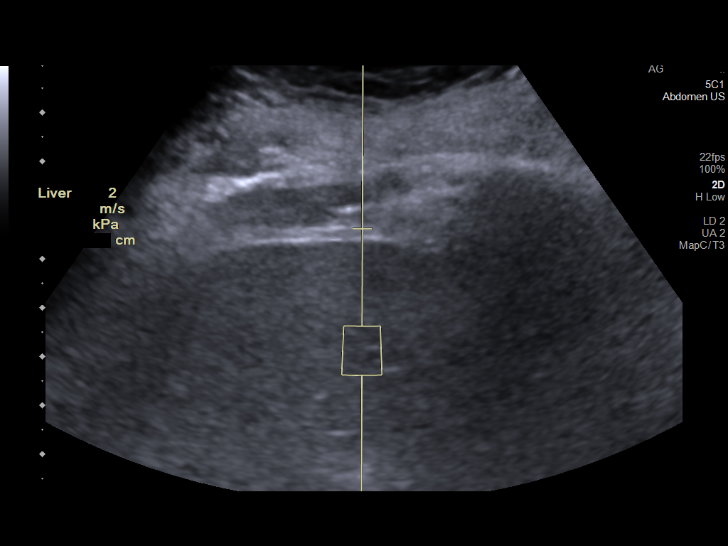

[12 of 25 positions shown; findings below may reference images not displayed]

FINDINGS: Liver: Increased echotexture compatible with fatty infiltration. No
focal abnormality or biliary ductal dilatation. Portal vein is
patent on color Doppler imaging with normal direction of blood flow
towards the liver.

ULTRASOUND HEPATIC ELASTOGRAPHY

Device: Siemens Helix VTQ

Patient position: Oblique

Transducer: [id number]

Number of measurements: 11

Hepatic segment:  8

Median kPa:

IQR:

IQR/Median kPa ratio:

Data quality:  Good

Diagnostic category: >9 kPa and ?13 kPa: suggestive of cACLD, but
needs further testing

The use of hepatic elastography is applicable to patients with viral
hepatitis and non-alcoholic fatty liver disease. At this time, there
is insufficient data for the referenced cut-off values and use in
other causes of liver disease, including alcoholic liver disease.
Patients, however, may be assessed by elastography and serve as
their own reference standard/baseline.

In patients with non-alcoholic liver disease, the values suggesting
compensated advanced chronic liver disease (cACLD) may be lower, and
patients may need additional testing with elasticity results of [DATE]
kPa.

Please note that abnormal hepatic elasticity and shear wave
velocities may also be identified in clinical settings other than
with hepatic fibrosis, such as: acute hepatitis, elevated right
heart and central venous pressures including use of beta blockers,
Dailius disease ([place]), infiltrative processes such as
mastocytosis/amyloidosis/infiltrative tumor/lymphoma, extrahepatic
cholestasis, with hyperemia in the post-prandial state, and with
liver transplantation. Correlation with patient history, laboratory
data, and clinical condition recommended.

Diagnostic Categories:

< or =5 kPa: high probability of being normal

< or =9 kPa: in the absence of other known clinical signs, rules [DATE] kPa and ?13 kPa: suggestive of cACLD, but needs further testing

>13 kPa: highly suggestive of cACLD

> or =17 kPa: highly suggestive of cACLD with an increased
probability of clinically significant portal hypertension
IMPRESSION: ULTRASOUND LIVER:

Hepatic steatosis

ULTRASOUND HEPATIC ELASTOGRAPHY:

Median kPa:

Diagnostic category: >9 kPa and ?13 kPa: suggestive of cACLD, but
needs further testing

## 2023-04-13 ENCOUNTER — Encounter: Payer: Self-pay | Admitting: Genetic Counselor

## 2023-04-13 ENCOUNTER — Other Ambulatory Visit: Payer: Self-pay | Admitting: Genetic Counselor

## 2023-04-13 DIAGNOSIS — Z8049 Family history of malignant neoplasm of other genital organs: Secondary | ICD-10-CM

## 2023-04-13 DIAGNOSIS — Z800 Family history of malignant neoplasm of digestive organs: Secondary | ICD-10-CM

## 2023-04-13 HISTORY — DX: Family history of malignant neoplasm of digestive organs: Z80.0

## 2023-04-13 HISTORY — DX: Family history of malignant neoplasm of other genital organs: Z80.49

## 2023-04-14 ENCOUNTER — Inpatient Hospital Stay: Payer: BC Managed Care – PPO

## 2023-04-14 ENCOUNTER — Inpatient Hospital Stay: Payer: BC Managed Care – PPO | Attending: Genetic Counselor | Admitting: Genetic Counselor

## 2023-04-14 DIAGNOSIS — Z800 Family history of malignant neoplasm of digestive organs: Secondary | ICD-10-CM

## 2023-04-14 DIAGNOSIS — Z8049 Family history of malignant neoplasm of other genital organs: Secondary | ICD-10-CM

## 2023-04-14 DIAGNOSIS — Z8052 Family history of malignant neoplasm of bladder: Secondary | ICD-10-CM

## 2023-04-14 LAB — GENETIC SCREENING ORDER

## 2023-04-15 ENCOUNTER — Encounter: Payer: Self-pay | Admitting: Genetic Counselor

## 2023-04-15 DIAGNOSIS — Z8052 Family history of malignant neoplasm of bladder: Secondary | ICD-10-CM | POA: Insufficient documentation

## 2023-04-15 NOTE — Progress Notes (Signed)
REFERRING PROVIDER: Romeo Rabon, MD [address]  [place] [address]  PRIMARY PROVIDER:  Romeo Rabon, MD  PRIMARY REASON FOR VISIT:  1. Family history of bladder cancer   2. Family history of uterine cancer   3. Family history of colon cancer      HISTORY OF PRESENT ILLNESS:   Mr. Friebel, a 52 y.o. male, was seen for a [facility] cancer genetics consultation at the request of Dr. Oscar La due to a family history of colon and other cancers.  Mr. Murrie presents to clinic today to discuss the possibility of a hereditary predisposition to cancer, genetic testing, and to further clarify his future cancer risks, as well as potential cancer risks for family members.   Mr. Glanzman is a 52 y.o. male with no personal history of cancer.  He is here to discuss genetic testing, based on his mother and grandmothers' diagnosis of cancer, and potentially his grandmother testing positive for a hereditary cancer syndrome.  CANCER HISTORY:  Oncology History   No history exists.     Past Medical History:  Diagnosis Date   Bleeding disorder (HCC) 04/12/2013   Clotting disorder (HCC)    Complication of anesthesia    Diabetes (HCC)    Family history of bladder cancer    Family history of colon cancer 04/13/2023   Family history of colon cancer    Family history of uterine cancer 04/13/2023   Family history of uterine cancer    Hypertension    Knee joint pain 03/29/2013   PONV (postoperative nausea and vomiting)    Qualitative platelet defect (HCC)    Skin cancer    Sleep apnea    Von Willebrand disease (HCC) 03/29/2013    Past Surgical History:  Procedure Laterality Date   BIOPSY  04/30/2020   Procedure: BIOPSY;  Surgeon: Corbin Ade, MD;  [facility];  Service: Endoscopy;;   COLONOSCOPY WITH PROPOFOL N/A 04/30/2020   Surgeon: Corbin Ade, MD;  Two 4-6 mm polyps in the descending colon and cecum, diverticulosis in the descending and sigmoid colon,  otherwise normal exam.  Pathology with sessile serrated adenoma in the cecum and hyperplastic polyp in the descending colon.  Recommend repeat colonoscopy in 5 years.   ESOPHAGOGASTRODUODENOSCOPY (EGD) WITH PROPOFOL N/A 04/30/2020   Surgeon: Corbin Ade, MD; abnormal distal esophagus, query short segment Barrett's esophagus s/p dilation and biopsy (pathology consistent with reflux), abnormal duodenum with scattered 1-2 mm white adherent plaques diffusely covering second portion of duodenum s/p biopsy (pathology unremarkable).   KNEE ARTHROSCOPY Left 2010   MALONEY DILATION N/A 04/30/2020   Procedure: Elease Hashimoto DILATION;  Surgeon: Corbin Ade, MD;  [facility];  Service: Endoscopy;  Laterality: N/A;   POLYPECTOMY  04/30/2020   Procedure: POLYPECTOMY;  Surgeon: Corbin Ade, MD;  [facility];  Service: Endoscopy;;   REPLACEMENT TOTAL KNEE Right 04/2020   TONSILLECTOMY Bilateral 2000    Social History   Socioeconomic History   Marital status: Married    Spouse name: Not on file   Number of children: Not on file   Years of education: Not on file   Highest education level: Not on file  Occupational History   Not on file  Tobacco Use   Smoking status: Never   Smokeless tobacco: Never  Substance and Sexual Activity   Alcohol use: Yes    Alcohol/week: 1.0 standard drink of alcohol    Types: 1 Glasses of  wine per week    Comment: couple beer a week.    Drug use: No   Sexual activity: Yes    Partners: Female    Comment: spouse  Other Topics Concern   Not on file  Social History Narrative   Not on file   Social Determinants of Health   Financial Resource Strain: Low Risk  (05/17/2020)   Received from [facility], [facility]   Overall Financial Resource Strain (CARDIA)    Difficulty of Paying Living Expenses: Not hard at all  Food Insecurity: No Food Insecurity (05/17/2020)   Received from [facility], [facility]   Hunger Vital  Sign    Worried About Running Out of Food in the Last Year: Never true    Ran Out of Food in the Last Year: Never true  Transportation Needs: No Transportation Needs (05/17/2020)   Received from [facility], [facility]   [facility] - Transportation    Lack of Transportation (Medical): No    Lack of Transportation (Non-Medical): No  Physical Activity: Not on file  Stress: Not on file  Social Connections: Not on file     FAMILY HISTORY:  We obtained a detailed, 4-generation family history.  Significant diagnoses are listed below: Family History  Problem Relation Age of Onset   Clotting disorder Mother        possibly, not diagnosed   Colon cancer Mother        diagnosed around 68 and again at 45   Cancer - Other Mother 68       ureter   Clotting disorder Maternal Grandmother        possibly had it, not formally diagnosed   Colon cancer Maternal Grandmother 89   Uterine cancer Maternal Grandmother 45   Breast cancer Maternal Grandmother 69   Kidney cancer Maternal Grandmother 1   Colon cancer Maternal Grandfather        d. 58s   Inflammatory bowel disease Neg Hx      The patient has a son and daughter who are cancer free.  He is an only child.  His parents are living.  The patient does not have any information about his father's side of the family.  The patient's mother is living.  She was diagnosed with colon cancer at 50 and 60 and also was found to have a tumor on her ureter.  She has two paternal half brothers and a maternal half brother and two sisters.  One paternal brother died of colon cancer at 4.  Her mother had multiple cancers including uterine at 48 colon and breast cancer at 54 and kidney and ureter cancer at 53.  Her sister had bladder cancer and her father had colon cancer.  The grandfather had colon cancer and died  in his 17's.  Mr. Vanloo is unaware of previous family history of genetic testing for hereditary cancer risks. There is no reported  Ashkenazi Jewish ancestry. There is no known consanguinity.  GENETIC COUNSELING ASSESSMENT: Mr. Pudwill is a 52 y.o. male with a family history of cancer which is somewhat suggestive of a Lynch syndrome and predisposition to cancer given the combination of cancer and the young ages of onset. We, therefore, discussed and recommended the following at today's visit.   DISCUSSION: We discussed that, in general, most cancer is not inherited in families, but instead is sporadic or familial. Sporadic cancers occur by chance and typically happen at older ages (>50 years)  as this type of cancer is caused by genetic changes acquired during an individual's lifetime. Some families have more cancers than would be expected by chance; however, the ages or types of cancer are not consistent with a known genetic mutation or known genetic mutations have been ruled out. This type of familial cancer is thought to be due to a combination of multiple genetic, environmental, hormonal, and lifestyle factors. While this combination of factors likely increases the risk of cancer, the exact source of this risk is not currently identifiable or testable.  We discussed that 5 - 10% of cancer is hereditary, with most cases of colon cancer being associated with Lynch syndrome.  There are other genes that can be associated with hereditary colon cancer syndromes.  These include APC, MUTYH and BMPR1A among others.  We discussed that testing is beneficial for several reasons including knowing how to follow individuals after completing their treatment, identifying whether potential treatment options such as PARP inhibitors would be beneficial, and understand if other family members could be at risk for cancer and allow them to undergo genetic testing.   We reviewed the characteristics, features and inheritance patterns of hereditary cancer syndromes. We also discussed genetic testing, including the appropriate family members to test, the process  of testing, insurance coverage and turn-around-time for results. We discussed the implications of a negative, positive, carrier and/or variant of uncertain significant result. Mr. Passman decided to pursue genetic testing.  We will hold his sample to determine the type of testing to perform based on his mothers test results.   Based on Mr. Bilotta's family history of cancer, he meets medical criteria for genetic testing. Despite that he meets criteria, he may still have an out of pocket cost. We discussed that if his out of pocket cost for testing is over $100, the laboratory will call and confirm whether he wants to proceed with testing.  If the out of pocket cost of testing is less than $100 he will be billed by the genetic testing laboratory.   We discussed that some people do not want to undergo genetic testing due to fear of genetic discrimination.  The Genetic Information Nondiscrimination Act (GINA) was signed into federal law in 2008. GINA prohibits health insurers and most employers from discriminating against individuals based on genetic information (including the results of genetic tests and family history information). According to GINA, health insurance companies cannot consider genetic information to be a preexisting condition, nor can they use it to make decisions regarding coverage or rates. GINA also makes it illegal for most employers to use genetic information in making decisions about hiring, firing, promotion, or terms of employment. It is important to note that GINA does not offer protections for life insurance, disability insurance, or long-term care insurance. GINA does not apply to those in the [employer], those who work for companies with less than 15 employees, and new life insurance or long-term disability insurance policies.  Health status due to a cancer diagnosis is not protected under GINA. More information about GINA can be found by visiting [email or web address].   PLAN: After  considering the risks, benefits, and limitations, Mr. Gentry provided informed consent to pursue genetic testing and the blood sample was sent to [facility] and will hold his sample until the results of his mother comes back and then determine the testing that is necessary at that time. Results should be available within approximately 2-3 weeks' time, at which point they will be disclosed by telephone  to Mr. Vivian, as will any additional recommendations warranted by these results. Mr. Tano will receive a summary of his genetic counseling visit and a copy of his results once available. This information will also be available in Epic.   Lastly, we encouraged Mr. Swartout to remain in contact with cancer genetics annually so that we can continuously update the family history and inform him of any changes in cancer genetics and testing that may be of benefit for this family.   Mr. Kerlin questions were answered to his satisfaction today. Our contact information was provided should additional questions or concerns arise. Thank you for the referral and allowing [name] to share in the care of your patient.   Ayiden Milliman P. Lowell Guitar, MS, [facility] Licensed, Patent attorney [email or web address] phone: [phone]  The patient was seen for a total of 30 minutes in face-to-face genetic counseling.  The patient brought his mother. Drs. Meliton Rattan, and/or [place] were available for questions, if needed..    _______________________________________________________________________ For Office Staff:  Number of people involved in session: 2 Was an Intern/ student involved with case: yes

## 2023-04-22 ENCOUNTER — Ambulatory Visit: Payer: BC Managed Care – PPO | Admitting: Gastroenterology

## 2023-05-09 NOTE — Progress Notes (Unsigned)
Referring Provider: Romeo Rabon, MD Primary Care Physician:  Romeo Rabon, MD Primary GI Physician: Dr. Jena Gauss  No chief complaint on file.   HPI:   Paul Curry is a 52 y.o. male  with history of adenomatous colon polyp due for surveillance in 2026, GERD, fatty liver with kPa 11.28 May 2021, prior transaminitis that resolved, presenting today for follow-up of diarrhea***   Last seen in the office 02/16/2023.  Reported acute onset LLQ abdominal pain and diarrhea after returning home from vacation in [place].  His abdominal pain had resolved, but watery stool continued.  Denied BRBPR, melena, nocturnal stools, new medications, nausea, vomiting.  Dicyclomine was not helpful.  Prior to this, he would have 1-2 solid bowel movements daily.  Denies family history of IBD.  Daughter with gluten allergy/celiac.  Plan to check stool studies, celiac screen, TSH, CBC, CMP, inflammatory markers.    C. difficile GDH and toxin A/B, GI pathogen panel, Giardia, Cryptosporidium all negative.  TSH within normal limits.  Celiac screen negative.  CRP and sed rate within normal limits.   Patient reported ongoing diarrhea, flatulence, abdominal cramping related to bowel movements though diarrhea had slowed down overall.  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  Planned for Xifaxan 550 mg 3 times daily x 14 days, but insurance would not cover this and was requesting Viberzi or amitriptyline.  Prefer not to commit patient to 1 of these 2 medications long-term as symptoms were improving.  Recommended starting daily probiotic along with Benefiber with 6-week follow-up.    Today:     Need to discuss fatty liver/elevated K PA***  Past Medical History:  Diagnosis Date   Bleeding disorder (HCC) 04/12/2013   Clotting disorder (HCC)    Complication of anesthesia    Diabetes (HCC)    Family history of bladder cancer    Family history of colon cancer 04/13/2023   Family history of colon cancer     Family history of uterine cancer 04/13/2023   Family history of uterine cancer    Hypertension    Knee joint pain 03/29/2013   PONV (postoperative nausea and vomiting)    Qualitative platelet defect (HCC)    Skin cancer    Sleep apnea    Von Willebrand disease (HCC) 03/29/2013    Past Surgical History:  Procedure Laterality Date   BIOPSY  04/30/2020   Procedure: BIOPSY;  Surgeon: Corbin Ade, MD;  [facility];  Service: Endoscopy;;   COLONOSCOPY WITH PROPOFOL N/A 04/30/2020   Surgeon: Corbin Ade, MD;  Two 4-6 mm polyps in the descending colon and cecum, diverticulosis in the descending and sigmoid colon, otherwise normal exam.  Pathology with sessile serrated adenoma in the cecum and hyperplastic polyp in the descending colon.  Recommend repeat colonoscopy in 5 years.   ESOPHAGOGASTRODUODENOSCOPY (EGD) WITH PROPOFOL N/A 04/30/2020   Surgeon: Corbin Ade, MD; abnormal distal esophagus, query short segment Barrett's esophagus s/p dilation and biopsy (pathology consistent with reflux), abnormal duodenum with scattered 1-2 mm white adherent plaques diffusely covering second portion of duodenum s/p biopsy (pathology unremarkable).   KNEE ARTHROSCOPY Left 2010   MALONEY DILATION N/A 04/30/2020   Procedure: Elease Hashimoto DILATION;  Surgeon: Corbin Ade, MD;  [facility];  Service: Endoscopy;  Laterality: N/A;   POLYPECTOMY  04/30/2020   Procedure: POLYPECTOMY;  Surgeon: Corbin Ade, MD;  [facility];  Service: Endoscopy;;   REPLACEMENT TOTAL KNEE Right 04/2020   TONSILLECTOMY Bilateral 2000  Current Outpatient Medications  Medication Sig Dispense Refill   acetaminophen (TYLENOL) 325 MG tablet Take 650 mg by mouth every 6 (six) hours as needed for pain.     furosemide (LASIX) 40 MG tablet Take 1 tablet by mouth every morning.     metoprolol succinate (TOPROL-XL) 50 MG 24 hr tablet Take 50 mg by mouth 2 (two) times daily. Take with or  immediately following a meal.     montelukast (SINGULAIR) 10 MG tablet Take 10 mg by mouth at bedtime.     Multiple Vitamin (MULTIVITAMIN WITH MINERALS) TABS tablet Take 1 tablet by mouth daily.     omeprazole (PRILOSEC) 40 MG capsule Take 40 mg by mouth daily.      PARoxetine (PAXIL) 20 MG tablet Take 20 mg by mouth daily.      pioglitazone (ACTOS) 15 MG tablet      Potassium Chloride ER 20 MEQ TBCR Take 1 tablet by mouth daily.     spironolactone (ALDACTONE) 25 MG tablet Take 1 tablet by mouth daily.     tamsulosin (FLOMAX) 0.4 MG CAPS capsule Take 0.4 mg by mouth daily.     terazosin (HYTRIN) 5 MG capsule Take 5 mg by mouth 2 (two) times daily.     valsartan-hydrochlorothiazide (DIOVAN-HCT) 320-25 MG tablet Take 1 tablet by mouth daily.      No current facility-administered medications for this visit.    Allergies as of 05/11/2023 - Review Complete 02/16/2023  Allergen Reaction Noted   Lisinopril Cough 12/07/2019   Metformin Diarrhea 02/16/2023    Family History  Problem Relation Age of Onset   Clotting disorder Mother        possibly, not diagnosed   Colon cancer Mother        diagnosed around 31 and again at 19   Cancer - Other Mother 53       ureter   Clotting disorder Maternal Grandmother        possibly had it, not formally diagnosed   Colon cancer Maternal Grandmother 63   Uterine cancer Maternal Grandmother 45   Breast cancer Maternal Grandmother 50   Kidney cancer Maternal Grandmother 49   Colon cancer Maternal Grandfather        d. 10s   Inflammatory bowel disease Neg Hx     Social History   Socioeconomic History   Marital status: Married    Spouse name: Not on file   Number of children: Not on file   Years of education: Not on file   Highest education level: Not on file  Occupational History   Not on file  Tobacco Use   Smoking status: Never   Smokeless tobacco: Never  Substance and Sexual Activity   Alcohol use: Yes    Alcohol/week: 1.0 standard  drink of alcohol    Types: 1 Glasses of wine per week    Comment: couple beer a week.    Drug use: No   Sexual activity: Yes    Partners: Female    Comment: spouse  Other Topics Concern   Not on file  Social History Narrative   Not on file   Social Determinants of Health   Financial Resource Strain: Low Risk  (05/17/2020)   [facility]   Overall Financial Resource Strain (CARDIA)    Difficulty of Paying Living Expenses: Not hard at all  Food Insecurity: No Food Insecurity (04/15/2023)   Hunger Vital Sign    Worried About Running  Out of Food in the Last Year: Never true    Ran Out of Food in the Last Year: Never true  Transportation Needs: No Transportation Needs (04/15/2023)   PRAPARE - Administrator, Civil Service (Medical): No    Lack of Transportation (Non-Medical): No  Physical Activity: Not on file  Stress: Not on file  Social Connections: Not on file    Review of Systems: Gen: Denies fever, chills, anorexia. Denies fatigue, weakness, weight loss.  CV: Denies chest pain, palpitations, syncope, peripheral edema, and claudication. Resp: Denies dyspnea at rest, cough, wheezing, coughing up blood, and pleurisy. GI: Denies vomiting blood, jaundice, and fecal incontinence.   Denies dysphagia or odynophagia. Derm: Denies rash, itching, dry skin Psych: Denies depression, anxiety, memory loss, confusion. No homicidal or suicidal ideation.  Heme: Denies bruising, bleeding, and enlarged lymph nodes.  Physical Exam: There were no vitals taken for this visit. General:   Alert and oriented. No distress noted. Pleasant and cooperative.  Head:  Normocephalic and atraumatic. Eyes:  Conjuctiva clear without scleral icterus. Heart:  S1, S2 present without murmurs appreciated. Lungs:  Clear to auscultation bilaterally. No wheezes, rales, or rhonchi. No distress.  Abdomen:  +BS, soft, non-tender and non-distended. No rebound or guarding.  No HSM or masses noted. Msk:  Symmetrical without gross deformities. Normal posture. Extremities:  Without edema. Neurologic:  Alert and  oriented x4 Psych:  Normal mood and affect.    Assessment:     Plan:  ***   Ermalinda Memos, PA-C [facility] 05/11/2023

## 2023-05-11 ENCOUNTER — Ambulatory Visit (INDEPENDENT_AMBULATORY_CARE_PROVIDER_SITE_OTHER): Payer: BC Managed Care – PPO | Admitting: Gastroenterology

## 2023-05-11 ENCOUNTER — Encounter: Payer: Self-pay | Admitting: *Deleted

## 2023-05-11 ENCOUNTER — Telehealth: Payer: Self-pay | Admitting: *Deleted

## 2023-05-11 ENCOUNTER — Encounter: Payer: Self-pay | Admitting: Gastroenterology

## 2023-05-11 VITALS — BP 188/115 | HR 65 | Temp 98.2°F | Ht 71.0 in | Wt 318.4 lb

## 2023-05-11 DIAGNOSIS — R1310 Dysphagia, unspecified: Secondary | ICD-10-CM | POA: Diagnosis not present

## 2023-05-11 DIAGNOSIS — K760 Fatty (change of) liver, not elsewhere classified: Secondary | ICD-10-CM

## 2023-05-11 DIAGNOSIS — I10 Essential (primary) hypertension: Secondary | ICD-10-CM

## 2023-05-11 DIAGNOSIS — K219 Gastro-esophageal reflux disease without esophagitis: Secondary | ICD-10-CM

## 2023-05-11 DIAGNOSIS — K625 Hemorrhage of anus and rectum: Secondary | ICD-10-CM

## 2023-05-11 DIAGNOSIS — Z87898 Personal history of other specified conditions: Secondary | ICD-10-CM

## 2023-05-11 NOTE — Telephone Encounter (Signed)
Pt informed of appt date, time and instructions. He says he may have to go out of town. Gave number to CS if needs to reschedule

## 2023-05-11 NOTE — Patient Instructions (Signed)
Please have blood work completed at [facility].  We will get you scheduled for an ultrasound of your liver at [facility].  We will get you scheduled for upper endoscopy with possible stretching of your esophagus and colonoscopy in the near future with Dr. Jena Gauss at [facility].  Continue omeprazole 40 mg daily.  Take this medication first thing in the morning, 30 minutes before breakfast.  Your blood pressure is significantly elevated today at 188/115.  Please take your blood pressure medications as soon as possible.  If your blood pressure remains elevated greater than 160/100, I recommend that you seek care today either from your primary care doctor, urgent care, ER.  If your blood pressure remains above 140/90, you should follow-up with your primary care provider as well.  If you develop headache, blurry vision, chest pain, you should proceed to the emergency room.  Instructions for fatty liver: Recommend 1-2# weight loss per week until ideal body weight through exercise & diet. Low fat/cholesterol diet.   Avoid sweets, sodas, fruit juices, sweetened beverages like tea, etc. Gradually increase exercise from 15 min daily up to 1 hr per day 5 days/week. Limit alcohol use.  I will plan to see you back in the office after your procedures.  I hope you have a fantastic Thanksgiving and Christmas!  Ermalinda Memos, PA-C [facility]

## 2023-05-11 NOTE — Telephone Encounter (Signed)
Faxed  need for perioperative medications for von Willebrand disease with [facility] hematology.

## 2023-05-13 LAB — HEPATIC FUNCTION PANEL
ALT: 23 IU/L (ref 0–44)
AST: 30 [IU]/L (ref 0–40)
Albumin: 4.4 g/dL (ref 3.8–4.9)
Alkaline Phosphatase: 69 [IU]/L (ref 44–121)
Bilirubin Total: 0.5 mg/dL (ref 0.0–1.2)
Bilirubin, Direct: 0.2 mg/dL (ref 0.00–0.40)
Total Protein: 6.6 g/dL (ref 6.0–8.5)

## 2023-05-13 LAB — NASH FIBROSURE(R) PLUS
ALPHA 2-MACROGLOBULINS, QN: 313 mg/dL — ABNORMAL HIGH (ref 110–276)
ALT (SGPT) P5P: 27 IU/L (ref 0–55)
AST (SGOT) P5P: 38 [IU]/L (ref 0–40)
Apolipoprotein A-1: 160 mg/dL (ref 101–178)
Bilirubin, Total: 0.3 mg/dL (ref 0.0–1.2)
Cholesterol, Total: 207 mg/dL — ABNORMAL HIGH (ref 100–199)
Fibrosis Score: 0.24 — ABNORMAL HIGH (ref 0.00–0.21)
GGT: 15 [IU]/L (ref 0–65)
Glucose: 107 mg/dL — ABNORMAL HIGH (ref 70–99)
Haptoglobin: 135 mg/dL (ref 29–370)
NASH Score: 0 (ref 0.00–0.25)
Steatosis Score: 0.2 (ref 0.00–0.40)
Triglycerides: 107 mg/dL (ref 0–149)

## 2023-05-13 LAB — ENHANCED LIVER FIBROSIS (ELF): ELF(TM) Score: 10.75 — ABNORMAL HIGH (ref <9.80)

## 2023-05-13 LAB — CBC WITH DIFFERENTIAL/PLATELET
Basophils Absolute: 0.1 10*3/uL (ref 0.0–0.2)
Basos: 2 %
EOS (ABSOLUTE): 0.1 10*3/uL (ref 0.0–0.4)
Eos: 4 %
Hematocrit: 41 % (ref 37.5–51.0)
Hemoglobin: 13.4 g/dL (ref 13.0–17.7)
Immature Grans (Abs): 0 10*3/uL (ref 0.0–0.1)
Immature Granulocytes: 0 %
Lymphocytes Absolute: 0.7 10*3/uL (ref 0.7–3.1)
Lymphs: 21 %
MCH: 29.3 pg (ref 26.6–33.0)
MCHC: 32.7 g/dL (ref 31.5–35.7)
MCV: 90 fL (ref 79–97)
Monocytes Absolute: 0.4 10*3/uL (ref 0.1–0.9)
Monocytes: 10 %
Neutrophils Absolute: 2.2 10*3/uL (ref 1.4–7.0)
Neutrophils: 63 %
Platelets: 153 10*3/uL (ref 150–450)
RBC: 4.58 x10E6/uL (ref 4.14–5.80)
RDW: 12.9 % (ref 11.6–15.4)
WBC: 3.5 10*3/uL (ref 3.4–10.8)

## 2023-05-18 ENCOUNTER — Ambulatory Visit (HOSPITAL_COMMUNITY)
Admission: RE | Admit: 2023-05-18 | Discharge: 2023-05-18 | Disposition: A | Discharge status: Home / Self Care | Payer: BC Managed Care – PPO | Source: Ambulatory Visit | Attending: Gastroenterology | Admitting: Gastroenterology

## 2023-05-18 DIAGNOSIS — K760 Fatty (change of) liver, not elsewhere classified: Secondary | ICD-10-CM | POA: Diagnosis present

## 2023-05-18 NOTE — Telephone Encounter (Signed)
Refaxed to hematology

## 2023-05-22 ENCOUNTER — Telehealth: Payer: Self-pay | Admitting: Genetic Counselor

## 2023-05-22 NOTE — Telephone Encounter (Signed)
I returned [name] from Wednesday 05/20/2023 asking about his results.  I left a VM stating that results should be back sometime between today and May 26, 2023.  I will call as soon as I receive them.

## 2023-05-26 ENCOUNTER — Telehealth: Payer: Self-pay | Admitting: Genetic Counselor

## 2023-05-26 ENCOUNTER — Encounter: Payer: Self-pay | Admitting: Genetic Counselor

## 2023-05-26 DIAGNOSIS — Z1379 Encounter for other screening for genetic and chromosomal anomalies: Secondary | ICD-10-CM | POA: Insufficient documentation

## 2023-05-26 NOTE — Telephone Encounter (Signed)
LM on VM that results are back and to please call. 

## 2023-05-27 NOTE — Telephone Encounter (Signed)
Response from [facility] hematology scanned under media tab

## 2023-05-28 ENCOUNTER — Telehealth: Payer: Self-pay | Admitting: Genetic Counselor

## 2023-05-28 ENCOUNTER — Ambulatory Visit: Payer: Self-pay | Admitting: Genetic Counselor

## 2023-05-28 ENCOUNTER — Other Ambulatory Visit: Payer: Self-pay | Admitting: Gastroenterology

## 2023-05-28 DIAGNOSIS — D691 Qualitative platelet defects: Secondary | ICD-10-CM

## 2023-05-28 DIAGNOSIS — Z1379 Encounter for other screening for genetic and chromosomal anomalies: Secondary | ICD-10-CM

## 2023-05-28 MED ORDER — TRANEXAMIC ACID 650 MG PO TABS
ORAL_TABLET | ORAL | 0 refills | Status: DC
Start: 2023-05-28 — End: 2023-10-21

## 2023-05-28 NOTE — Telephone Encounter (Signed)
Reviewed recommendations.  Patient is to take tranexamic acid, 2 tablets (1300 mg total) by mouth the night before EGD/colonoscopy in the morning of EGD/colonoscopy.  If biopsies are taken or polyps removed, will need to continue 2 tablets every 8 hours for 7 days.  If any concern for significant bleeding post procedurally, he will need 1 unit of platelets transfused.  I went ahead and sent the prescription of tranexamic acid to his pharmacy.  Please add these instructions to his prep instructions.

## 2023-05-28 NOTE — Telephone Encounter (Signed)
Revealed that the patient tested negative for the familial MSH2 Lynch syndrome variant running in the family.  This is a true negative, meaning that his mother has the mutation and he was tested and does not have it.  We would not expect him to be at increased risk for Lynch syndrome cancers than others in the general population.  Recommend population screening for all cancers and following up accordingly based on those results.

## 2023-05-28 NOTE — Progress Notes (Signed)
HPI:  [name] was previously seen in the [facility] due to a family history of cancer and concerns regarding a hereditary predisposition to cancer due to his mother having a MSH2 pathogenic variant diagnosing her with Lynch syndrome. Please refer to our prior cancer genetics clinic note for more information regarding our discussion, assessment and recommendations, at the time. [name] recent genetic test results were disclosed to him, as were recommendations warranted by these results. These results and recommendations are discussed in more detail below.  CANCER HISTORY:  Oncology History   No history exists.    FAMILY HISTORY:  We obtained a detailed, 4-generation family history.  Significant diagnoses are listed below: Family History  Problem Relation Age of Onset   Clotting disorder Mother        possibly, not diagnosed   Colon cancer Mother        diagnosed around 29 and again at 30   Cancer - Other Mother 57       ureter   Clotting disorder Maternal Grandmother        possibly had it, not formally diagnosed   Colon cancer Maternal Grandmother 66   Uterine cancer Maternal Grandmother 45   Breast cancer Maternal Grandmother 73   Kidney cancer Maternal Grandmother 70   Colon cancer Maternal Grandfather        d. 60s   Inflammatory bowel disease Neg Hx        The patient has a son and daughter who are cancer free.  He is an only child.  His parents are living.   The patient does not have any information about his father's side of the family.   The patient's mother is living.  She was diagnosed with colon cancer at 50 and 60 and also was found to have a tumor on her ureter.  She has two paternal half brothers and a maternal half brother and two sisters.  One paternal brother died of colon cancer at 66.  Her mother had multiple cancers including uterine at 56 colon and breast cancer at 54 and kidney and ureter cancer at 43.  Her sister had bladder  cancer and her father had colon cancer.  The grandfather had colon cancer and died  in his 72's.   [name] is unaware of previous family history of genetic testing for hereditary cancer risks. There is no reported Ashkenazi Jewish ancestry. There is no known consanguinity.  GENETIC TEST RESULTS: We recommended [name] pursue testing for the familial hereditary cancer gene mutation called MSH2 [id number] ([id number]). [name] test was normal and did not reveal the familial mutation. We call this result a true negative result because the cancer-causing mutation was identified in [name] family, and he did not inherit it.  Given this negative result, [name] chances of developing Lynch-related cancers are the same as they are in the general population.    ADDITIONAL GENETIC TESTING: We discussed with [name] that there are other genes that are associated with increased cancer risk that can be analyzed. Should [name] wish to pursue additional genetic testing, we are happy to discuss and coordinate this testing, at any time.    CANCER SCREENING RECOMMENDATIONS: [name] test result is considered negative (normal).  Therefore, it is recommended he continue to follow the cancer management and screening guidelines provided by his primary healthcare provider. An individual's cancer risk and medical management are not determined by genetic test results alone. Overall  cancer risk assessment incorporates additional factors, including personal medical history, family history, and any available genetic information that may result in a personalized plan for cancer prevention and surveillance  RECOMMENDATIONS FOR FAMILY MEMBERS:  Individuals in this family might be at some increased risk of developing cancer, over the general population risk, simply due to the family history of cancer.  We recommended women in this family have a yearly mammogram beginning at age 38, or 10 years  younger than the earliest onset of cancer, an annual clinical breast exam, and perform monthly breast self-exams. Women in this family should also have a gynecological exam as recommended by their primary provider. All family members should be referred for colonoscopy starting at age 65.  FOLLOW-UP: Lastly, we discussed with [name] that cancer genetics is a rapidly advancing field and it is possible that new genetic tests will be appropriate for him and/or his family members in the future. We encouraged him to remain in contact with cancer genetics on an annual basis so we can update his personal and family histories and let him know of advances in cancer genetics that may benefit this family.   Our contact number was provided. [name] questions were answered to his satisfaction, and he knows he is welcome to call us at anytime with additional questions or concerns.   Maylon Cos, MS, [facility] Licensed, Certified Genetic Counselor [email or web address]

## 2023-05-29 ENCOUNTER — Other Ambulatory Visit: Payer: Self-pay | Admitting: *Deleted

## 2023-05-29 ENCOUNTER — Encounter: Payer: Self-pay | Admitting: *Deleted

## 2023-05-29 MED ORDER — PEG 3350-KCL-NA BICARB-NACL 420 G PO SOLR: 4000.0000 mL | Freq: Once | ORAL | 0 refills | Status: AC

## 2023-05-29 NOTE — Telephone Encounter (Signed)
Pt has been scheduled for 07/15/23. Instructions mailed and prep sent to the pharmacy. Pt was advised of md message.instructions added to prep instructions.

## 2023-06-10 ENCOUNTER — Encounter: Payer: Self-pay | Admitting: *Deleted

## 2023-06-10 ENCOUNTER — Telehealth: Payer: Self-pay | Admitting: *Deleted

## 2023-06-10 NOTE — Telephone Encounter (Signed)
Pt left vm stating he needed to reschedule his procedures in January.  LMTRC

## 2023-07-13 ENCOUNTER — Other Ambulatory Visit (HOSPITAL_COMMUNITY): Payer: BC Managed Care – PPO

## 2023-08-13 NOTE — Patient Instructions (Addendum)
 Paul Curry  08/13/2023     @PREFPERIOPPHARMACY @   Your procedure is scheduled on  08/19/2023.   Report to [facility] at  [phone]  A.M.   Call this number if you have problems the morning of surgery:  [phone]  If you experience any cold or flu symptoms such as cough, fever, chills, shortness of breath, etc. between now and your scheduled surgery, please notify us at the above number.   Remember:  Follow he diet and prep instructions given to you by the office.    You may drink clear liquids until 0430 am on 08/19/2023.    Clear liquids allowed are:                    Water, Juice (No red color; non-citric and without pulp; diabetics please choose diet or no sugar options), Carbonated beverages (diabetics please choose diet or no sugar options), Clear Tea (No creamer, milk, or cream, including half & half and powdered creamer), Black Coffee Only (No creamer, milk or cream, including half & half and powdered creamer), and Clear Sports drink (No red color; diabetics please choose diet or no sugar options)    Take these medicines the morning of surgery with A SIP OF WATER             metoprolol, omeprazole, paroxetine, tamsulosin, tranexamic acid.    Do not wear jewelry, make-up or nail polish, including gel polish,  artificial nails, or any other type of covering on natural nails (fingers and  toes).  Do not wear lotions, powders, or perfumes, or deodorant.  Do not shave 48 hours prior to surgery.  Men may shave face and neck.  Do not bring valuables to the hospital.  [facility] is not responsible for any belongings or valuables.  Contacts, dentures or bridgework may not be worn into surgery.  Leave your suitcase in the car.  After surgery it may be brought to your room.  For patients admitted to the hospital, discharge time will be determined by your treatment team.  Patients discharged the day of surgery will not be allowed to drive home and must have someone  with them for 24 hours.    Special instructions:   DO NOT smoke tobacco or vape for 24 hours before your procedure.  Please read over the following fact sheets that you were given. Anesthesia Post-op Instructions and Care and Recovery After Surgery       Upper Endoscopy, Adult, Care After After the procedure, it is common to have a sore throat. It is also common to have: Mild stomach pain or discomfort. Bloating. Nausea. Follow these instructions at home: The instructions below may help you care for yourself at home. Your health care provider may give you more instructions. If you have questions, ask your health care provider. If you were given a sedative during the procedure, it can affect you for several hours. Do not drive or operate machinery until your health care provider says that it is safe. If you will be going home right after the procedure, plan to have a responsible adult: Take you home from the hospital or clinic. You will not be allowed to drive. Care for you for the time you are told. Follow instructions from your health care provider about what you may eat and drink. Return to your normal activities as told by your health care provider. Ask your health care provider what activities are  safe for you. Take over-the-counter and prescription medicines only as told by your health care provider. Contact a health care provider if you: Have a sore throat that lasts longer than one day. Have trouble swallowing. Have a fever. Get help right away if you: Vomit blood or your vomit looks like coffee grounds. Have bloody, black, or tarry stools. Have a very bad sore throat or you cannot swallow. Have difficulty breathing or very bad pain in your chest or abdomen. These symptoms may be an emergency. Get help right away. Call 911. Do not wait to see if the symptoms will go away. Do not drive yourself to the hospital. Summary After the procedure, it is common to have a sore  throat, mild stomach discomfort, bloating, and nausea. If you were given a sedative during the procedure, it can affect you for several hours. Do not drive until your health care provider says that it is safe. Follow instructions from your health care provider about what you may eat and drink. Return to your normal activities as told by your health care provider. This information is not intended to replace advice given to you by your health care provider. Make sure you discuss any questions you have with your health care provider. Document Revised: 09/18/2021 Document Reviewed: 09/18/2021 Elsevier Patient Education  2024 Elsevier Inc. Esophageal Dilatation Esophageal dilatation, or dilation, is done to stretch a blocked or narrowed part of your esophagus. The esophagus is the part of your body that moves food from your mouth to your stomach. You may need to have it stretched if: You have a lot of scar tissue and it makes it hard or painful to swallow. You have cancer of the esophagus. There's a problem with how food moves through your esophagus. In some cases, you may need to have this procedure done more than once. Tell a health care provider about: Any allergies you have. All medicines you're taking, including vitamins, herbs, eye drops, creams, and over-the-counter medicines. Any problems you or family members have had with anesthesia. Any bleeding problems you have. Any surgeries you've had. Any medical conditions you have. Whether you're pregnant or may be pregnant. What are the risks? Your health care provider will talk with you about risks. These may include: Bleeding. A hole or tear in your esophagus. What happens before the procedure? When to stop eating and drinking Follow instructions from your provider about what you may eat and drink. These may include: 8 hours before your procedure Stop eating most foods. Do not eat meat, fried foods, or fatty foods. Eat only light foods,  such as toast or crackers. All liquids are okay except energy drinks and alcohol. 6 hours before your procedure Stop eating. Drink only clear liquids, such as water, clear fruit juice, black coffee, plain tea, and sports drinks. Do not drink energy drinks or alcohol. 2 hours before your procedure Stop drinking all liquids. You may be allowed to take medicines with small sips of water. If you don't follow your provider's instructions, your procedure may be delayed or canceled. Medicines Ask your provider about: Changing or stopping your regular medicines. These include any diabetes medicines or blood thinners you take. Taking medicines such as aspirin and ibuprofen. These medicines can thin your blood. Do not take them unless your provider tells you to. Taking over-the-counter medicines, vitamins, herbs, and supplements. General instructions If you'll be going home right after the procedure, plan to have a responsible adult: Take you home from the hospital or  clinic. You won't be allowed to drive. Care for you for the time you're told. What happens during the procedure? You may be given: A sedative. This helps you relax. Anesthesia. This keeps you from feeling pain. It will numb certain areas of your body. The stretching may be done with: Simple dilators. These are tools put in your esophagus to stretch it. Guide wires. These wires are put in using a tube called an endoscope. A dilator is put over the wires to stretch your esophagus. Then the wires are taken out. A balloon. The balloon is on the end of a tube. It's inflated to stretch your esophagus. The procedure may vary among providers and hospitals. What can I expect after the procedure? Your blood pressure, heart rate, breathing rate, and blood oxygen level will be monitored until you leave the hospital or clinic. Your throat may feel sore and numb. This will get better over time. You won't be allowed to eat or drink until your  throat is no longer numb. You may be able to go home when you can: Drink. Pee. Sit on the edge of the bed without nausea or dizziness. Follow these instructions at home: Activity If you were given a sedative during the procedure, it can affect you for several hours. Do not drive or operate machinery until your provider says it's safe. Return to your normal activities as told by your provider. Ask your provider what activities are safe for you. General instructions Take over-the-counter and prescription medicines only as told by your provider. Follow instructions from your provider about what you may eat and drink. Do not use any products that contain nicotine or tobacco. These products include cigarettes, chewing tobacco, and vaping devices, such as e-cigarettes. If you need help quitting, ask your provider. Keep all follow-up visits. Your provider will make sure the procedure worked. Where to find more information American Society for Gastrointestinal Endoscopy (ASGE): asge.org Contact a health care provider if: You have trouble swallowing. You have a fever. Your pain doesn't get better with medicine. Get help right away if: You have chest pain. You have trouble breathing. You vomit blood. Your poop is: Black. Tarry. Bloody. These symptoms may be an emergency. Get help right away. Call 911. Do not wait to see if the symptoms will go away. Do not drive yourself to the hospital. This information is not intended to replace advice given to you by your health care provider. Make sure you discuss any questions you have with your health care provider. Document Revised: 09/05/2022 Document Reviewed: 09/05/2022 Elsevier Patient Education  2024 Elsevier Inc.Colonoscopy, Adult, Care After The following information offers guidance on how to care for yourself after your procedure. Your health care provider may also give you more specific instructions. If you have problems or questions, contact  your health care provider. What can I expect after the procedure? After the procedure, it is common to have: A small amount of blood in your stool for 24 hours after the procedure. Some gas. Mild cramping or bloating of your abdomen. Follow these instructions at home: Eating and drinking  Drink enough fluid to keep your urine pale yellow. Follow instructions from your health care provider about eating or drinking restrictions. Resume your normal diet as told by your health care provider. Avoid heavy or fried foods that are hard to digest. Activity Rest as told by your health care provider. Avoid sitting for a long time without moving. Get up to take short walks every 1-2 hours.  This is important to improve blood flow and breathing. Ask for help if you feel weak or unsteady. Return to your normal activities as told by your health care provider. Ask your health care provider what activities are safe for you. Managing cramping and bloating  Try walking around when you have cramps or feel bloated. If directed, apply heat to your abdomen as told by your health care provider. Use the heat source that your health care provider recommends, such as a moist heat pack or a heating pad. Place a towel between your skin and the heat source. Leave the heat on for 20-30 minutes. Remove the heat if your skin turns bright red. This is especially important if you are unable to feel pain, heat, or cold. You have a greater risk of getting burned. General instructions If you were given a sedative during the procedure, it can affect you for several hours. Do not drive or operate machinery until your health care provider says that it is safe. For the first 24 hours after the procedure: Do not sign important documents. Do not drink alcohol. Do your regular daily activities at a slower pace than normal. Eat soft foods that are easy to digest. Take over-the-counter and prescription medicines only as told by your  health care provider. Keep all follow-up visits. This is important. Contact a health care provider if: You have blood in your stool 2-3 days after the procedure. Get help right away if: You have more than a small spotting of blood in your stool. You have large blood clots in your stool. You have swelling of your abdomen. You have nausea or vomiting. You have a fever. You have increasing pain in your abdomen that is not relieved with medicine. These symptoms may be an emergency. Get help right away. Call 911. Do not wait to see if the symptoms will go away. Do not drive yourself to the hospital. Summary After the procedure, it is common to have a small amount of blood in your stool. You may also have mild cramping and bloating of your abdomen. If you were given a sedative during the procedure, it can affect you for several hours. Do not drive or operate machinery until your health care provider says that it is safe. Get help right away if you have a lot of blood in your stool, nausea or vomiting, a fever, or increased pain in your abdomen. This information is not intended to replace advice given to you by your health care provider. Make sure you discuss any questions you have with your health care provider. Document Revised: 07/22/2022 Document Reviewed: 01/30/2021 Elsevier Patient Education  2024 Elsevier Inc.Monitored Anesthesia Care, Care After The following information offers guidance on how to care for yourself after your procedure. Your health care provider may also give you more specific instructions. If you have problems or questions, contact your health care provider. What can I expect after the procedure? After the procedure, it is common to have: Tiredness. Little or no memory about what happened during or after the procedure. Impaired judgment when it comes to making decisions. Nausea or vomiting. Some trouble with balance. Follow these instructions at home: For the time  period you were told by your health care provider:  Rest. Do not participate in activities where you could fall or become injured. Do not drive or use machinery. Do not drink alcohol. Do not take sleeping pills or medicines that cause drowsiness. Do not make important decisions or sign legal documents.  Do not take care of children on your own. Medicines Take over-the-counter and prescription medicines only as told by your health care provider. If you were prescribed antibiotics, take them as told by your health care provider. Do not stop using the antibiotic even if you start to feel better. Eating and drinking Follow instructions from your health care provider about what you may eat and drink. Drink enough fluid to keep your urine pale yellow. If you vomit: Drink clear fluids slowly and in small amounts as you are able. Clear fluids include water, ice chips, low-calorie sports drinks, and fruit juice that has water added to it (diluted fruit juice). Eat light and bland foods in small amounts as you are able. These foods include bananas, applesauce, rice, lean meats, toast, and crackers. General instructions  Have a responsible adult stay with you for the time you are told. It is important to have someone help care for you until you are awake and alert. If you have sleep apnea, surgery and some medicines can increase your risk for breathing problems. Follow instructions from your health care provider about wearing your sleep device: When you are sleeping. This includes during daytime naps. While taking prescription pain medicines, sleeping medicines, or medicines that make you drowsy. Do not use any products that contain nicotine or tobacco. These products include cigarettes, chewing tobacco, and vaping devices, such as e-cigarettes. If you need help quitting, ask your health care provider. Contact a health care provider if: You feel nauseous or vomit every time you eat or drink. You feel  light-headed. You are still sleepy or having trouble with balance after 24 hours. You get a rash. You have a fever. You have redness or swelling around the IV site. Get help right away if: You have trouble breathing. You have new confusion after you get home. These symptoms may be an emergency. Get help right away. Call 911. Do not wait to see if the symptoms will go away. Do not drive yourself to the hospital. This information is not intended to replace advice given to you by your health care provider. Make sure you discuss any questions you have with your health care provider. Document Revised: 11/04/2021 Document Reviewed: 11/04/2021 Elsevier Patient Education  2024 [employer].

## 2023-08-17 ENCOUNTER — Encounter (HOSPITAL_COMMUNITY): Payer: Self-pay

## 2023-08-17 ENCOUNTER — Encounter (HOSPITAL_COMMUNITY)
Admission: RE | Admit: 2023-08-17 | Discharge: 2023-08-17 | Disposition: A | Discharge status: Home / Self Care | Payer: BC Managed Care – PPO | Source: Ambulatory Visit | Attending: Internal Medicine | Admitting: Internal Medicine

## 2023-08-17 VITALS — Ht 72.0 in | Wt 310.0 lb

## 2023-08-17 DIAGNOSIS — Z01818 Encounter for other preprocedural examination: Secondary | ICD-10-CM | POA: Insufficient documentation

## 2023-08-17 DIAGNOSIS — E119 Type 2 diabetes mellitus without complications: Secondary | ICD-10-CM | POA: Diagnosis not present

## 2023-08-17 DIAGNOSIS — K621 Rectal polyp: Secondary | ICD-10-CM | POA: Diagnosis not present

## 2023-08-17 DIAGNOSIS — K641 Second degree hemorrhoids: Secondary | ICD-10-CM | POA: Diagnosis not present

## 2023-08-17 DIAGNOSIS — I10 Essential (primary) hypertension: Secondary | ICD-10-CM | POA: Diagnosis not present

## 2023-08-17 DIAGNOSIS — K921 Melena: Secondary | ICD-10-CM | POA: Diagnosis present

## 2023-08-17 DIAGNOSIS — K644 Residual hemorrhoidal skin tags: Secondary | ICD-10-CM | POA: Diagnosis not present

## 2023-08-17 DIAGNOSIS — D6800 Von Willebrand disease, unspecified: Secondary | ICD-10-CM | POA: Diagnosis not present

## 2023-08-17 DIAGNOSIS — Z7984 Long term (current) use of oral hypoglycemic drugs: Secondary | ICD-10-CM | POA: Diagnosis not present

## 2023-08-17 DIAGNOSIS — G4730 Sleep apnea, unspecified: Secondary | ICD-10-CM | POA: Diagnosis not present

## 2023-08-17 DIAGNOSIS — R1310 Dysphagia, unspecified: Secondary | ICD-10-CM | POA: Diagnosis not present

## 2023-08-17 DIAGNOSIS — Z79899 Other long term (current) drug therapy: Secondary | ICD-10-CM | POA: Insufficient documentation

## 2023-08-17 DIAGNOSIS — K219 Gastro-esophageal reflux disease without esophagitis: Secondary | ICD-10-CM | POA: Diagnosis not present

## 2023-08-17 LAB — BASIC METABOLIC PANEL
Anion gap: 9 (ref 5–15)
BUN: 26 mg/dL — ABNORMAL HIGH (ref 6–20)
CO2: 27 mmol/L (ref 22–32)
Calcium: 9.5 mg/dL (ref 8.9–10.3)
Chloride: 101 mmol/L (ref 98–111)
Creatinine, Ser: 1.08 mg/dL (ref 0.61–1.24)
GFR, Estimated: >60 mL/min (ref >60)
Glucose, Bld: 95 mg/dL (ref 70–99)
Potassium: 4 mmol/L (ref 3.5–5.1)
Sodium: 137 mmol/L (ref 135–145)

## 2023-08-18 NOTE — Anesthesia Preprocedure Evaluation (Signed)
 Anesthesia Evaluation  Patient identified by MRN, date of birth, ID band Patient awake    Reviewed: Allergy & Precautions, NPO status , Patient's Chart, lab work & pertinent test results  History of Anesthesia Complications (+) PONV, DIFFICULT AIRWAY and history of anesthetic complications  Airway Mallampati: II  TM Distance: >3 FB Neck ROM: Full    Dental no notable dental hx. (+) Dental Advisory Given, Teeth Intact   Pulmonary sleep apnea and Continuous Positive Airway Pressure Ventilation    Pulmonary exam normal breath sounds clear to auscultation       Cardiovascular Exercise Tolerance: Good hypertension, Pt. on medications Normal cardiovascular exam Rhythm:Regular Rate:Normal     Neuro/Psych negative neurological ROS  negative psych ROS   GI/Hepatic negative GI ROS, Neg liver ROS,,,  Endo/Other  diabetes, Well Controlled, Type 2    Renal/GU      Musculoskeletal   Abdominal   Peds  Hematology  (+) Blood dyscrasia (von WILLIEBRAND Disease)   Anesthesia Other Findings   Reproductive/Obstetrics                             Anesthesia Physical Anesthesia Plan  ASA: 3  Anesthesia Plan: General   Post-op Pain Management: Minimal or no pain anticipated   Induction: Intravenous  PONV Risk Score and Plan: Propofol infusion  Airway Management Planned: Nasal Cannula and Natural Airway  Additional Equipment:   Intra-op Plan:   Post-operative Plan:   Informed Consent: I have reviewed the patients History and Physical, chart, labs and discussed the procedure including the risks, benefits and alternatives for the proposed anesthesia with the patient or authorized representative who has indicated his/her understanding and acceptance.     Dental advisory given  Plan Discussed with: CRNA and Surgeon  Anesthesia Plan Comments:         Anesthesia Quick Evaluation

## 2023-08-19 ENCOUNTER — Encounter (HOSPITAL_COMMUNITY)
Admission: RE | Disposition: A | Discharge status: Home / Self Care | Payer: Self-pay | Source: Home / Self Care | Attending: Internal Medicine

## 2023-08-19 ENCOUNTER — Ambulatory Visit (HOSPITAL_COMMUNITY)
Admission: RE | Admit: 2023-08-19 | Discharge: 2023-08-19 | Disposition: A | Discharge status: Home / Self Care | Payer: BC Managed Care – PPO | Attending: Internal Medicine | Admitting: Internal Medicine

## 2023-08-19 ENCOUNTER — Ambulatory Visit (HOSPITAL_COMMUNITY): Payer: Self-pay | Admitting: Anesthesiology

## 2023-08-19 DIAGNOSIS — R197 Diarrhea, unspecified: Secondary | ICD-10-CM

## 2023-08-19 DIAGNOSIS — Z7984 Long term (current) use of oral hypoglycemic drugs: Secondary | ICD-10-CM | POA: Insufficient documentation

## 2023-08-19 DIAGNOSIS — K621 Rectal polyp: Secondary | ICD-10-CM | POA: Diagnosis not present

## 2023-08-19 DIAGNOSIS — K625 Hemorrhage of anus and rectum: Secondary | ICD-10-CM

## 2023-08-19 DIAGNOSIS — K219 Gastro-esophageal reflux disease without esophagitis: Secondary | ICD-10-CM | POA: Diagnosis not present

## 2023-08-19 DIAGNOSIS — K641 Second degree hemorrhoids: Secondary | ICD-10-CM | POA: Diagnosis not present

## 2023-08-19 DIAGNOSIS — K644 Residual hemorrhoidal skin tags: Secondary | ICD-10-CM | POA: Insufficient documentation

## 2023-08-19 DIAGNOSIS — K2289 Other specified disease of esophagus: Secondary | ICD-10-CM

## 2023-08-19 DIAGNOSIS — K921 Melena: Secondary | ICD-10-CM | POA: Insufficient documentation

## 2023-08-19 DIAGNOSIS — R1310 Dysphagia, unspecified: Secondary | ICD-10-CM | POA: Diagnosis not present

## 2023-08-19 DIAGNOSIS — I10 Essential (primary) hypertension: Secondary | ICD-10-CM | POA: Insufficient documentation

## 2023-08-19 DIAGNOSIS — K3189 Other diseases of stomach and duodenum: Secondary | ICD-10-CM

## 2023-08-19 DIAGNOSIS — Z860101 Personal history of adenomatous and serrated colon polyps: Secondary | ICD-10-CM

## 2023-08-19 DIAGNOSIS — E119 Type 2 diabetes mellitus without complications: Secondary | ICD-10-CM | POA: Insufficient documentation

## 2023-08-19 DIAGNOSIS — D6800 Von Willebrand disease, unspecified: Secondary | ICD-10-CM | POA: Insufficient documentation

## 2023-08-19 DIAGNOSIS — G4730 Sleep apnea, unspecified: Secondary | ICD-10-CM | POA: Insufficient documentation

## 2023-08-19 HISTORY — PX: ESOPHAGOGASTRODUODENOSCOPY (EGD) WITH PROPOFOL: SHX5813

## 2023-08-19 HISTORY — PX: BIOPSY: SHX5522

## 2023-08-19 HISTORY — PX: POLYPECTOMY: SHX5525

## 2023-08-19 HISTORY — PX: COLONOSCOPY WITH PROPOFOL: SHX5780

## 2023-08-19 HISTORY — PX: MALONEY DILATION: SHX5535

## 2023-08-19 LAB — GLUCOSE, CAPILLARY: Glucose-Capillary: 114 mg/dL — ABNORMAL HIGH (ref 70–99)

## 2023-08-19 SURGERY — COLONOSCOPY WITH PROPOFOL
Anesthesia: General

## 2023-08-19 MED ORDER — SODIUM CHLORIDE 0.9% FLUSH: 3.0000 mL | INTRAVENOUS | Status: DC | PRN

## 2023-08-19 MED ORDER — LIDOCAINE HCL (PF) 2 % IJ SOLN
INTRAMUSCULAR | Status: AC
  Filled 2023-08-19: qty 5

## 2023-08-19 MED ORDER — SODIUM CHLORIDE 0.9% FLUSH
3.0000 mL | Freq: Two times a day (BID) | INTRAVENOUS | Status: DC
Start: 2023-08-19 — End: 2023-08-19

## 2023-08-19 MED ORDER — GLYCOPYRROLATE PF 0.2 MG/ML IJ SOSY
PREFILLED_SYRINGE | INTRAMUSCULAR | Status: AC
  Filled 2023-08-19: qty 1

## 2023-08-19 MED ORDER — PROPOFOL 500 MG/50ML IV EMUL
INTRAVENOUS | Status: DC | PRN
  Administered 2023-08-19: 150 ug/kg/min via INTRAVENOUS

## 2023-08-19 MED ORDER — LIDOCAINE HCL (CARDIAC) PF 100 MG/5ML IV SOSY
PREFILLED_SYRINGE | INTRAVENOUS | Status: DC | PRN
  Administered 2023-08-19: 100 mg via INTRATRACHEAL

## 2023-08-19 MED ORDER — SIMETHICONE 40 MG/0.6ML PO SUSP
ORAL | Status: DC | PRN
  Administered 2023-08-19: 60 mL

## 2023-08-19 MED ORDER — LACTATED RINGERS IV SOLN: INTRAVENOUS | Status: DC | PRN

## 2023-08-19 MED ORDER — PROPOFOL 10 MG/ML IV BOLUS
INTRAVENOUS | Status: DC | PRN
  Administered 2023-08-19: 80 mg via INTRAVENOUS
  Administered 2023-08-19: 40 mg via INTRAVENOUS

## 2023-08-19 MED ORDER — GLYCOPYRROLATE PF 0.2 MG/ML IJ SOSY
PREFILLED_SYRINGE | INTRAMUSCULAR | Status: DC | PRN
  Administered 2023-08-19 (×2): .1 mg via INTRAVENOUS

## 2023-08-19 MED ORDER — PROPOFOL 1000 MG/100ML IV EMUL
INTRAVENOUS | Status: AC
  Filled 2023-08-19: qty 100

## 2023-08-19 NOTE — Transfer of Care (Addendum)
 Immediate Anesthesia Transfer of Care Note  Patient: Paul Curry  Procedure(s) Performed: COLONOSCOPY WITH PROPOFOL ESOPHAGOGASTRODUODENOSCOPY (EGD) WITH PROPOFOL MALONEY DILATION BIOPSY POLYPECTOMY  Patient Location: Short Stay  Anesthesia Type:General  Level of Consciousness: drowsy and patient cooperative  Airway & Oxygen Therapy: Patient Spontanous Breathing and Patient connected to face mask oxygen  Post-op Assessment: Report given to RN and Post -op Vital signs reviewed and stable  Post vital signs: Reviewed and stable  Last Vitals:  Vitals Value Taken Time  BP 82/56 08/19/23   0923  Temp 36.6 08/19/23   0923  Pulse 77 08/19/23   0923  Resp 13 08/19/23   0923  SpO2 98 5 08/19/23   0923    Last Pain:  Vitals:   08/19/23 0834  PainSc: 0-No pain         Complications: Blood pressure 112/80 at 0929.

## 2023-08-19 NOTE — Discharge Instructions (Signed)
 EGD Discharge instructions Please read the instructions outlined below and refer to this sheet in the next few weeks. These discharge instructions provide you with general information on caring for yourself after you leave the hospital. Your doctor may also give you specific instructions. While your treatment has been planned according to the most current medical practices available, unavoidable complications occasionally occur. If you have any problems or questions after discharge, please call your doctor. ACTIVITY You may resume your regular activity but move at a slower pace for the next 24 hours.  Take frequent rest periods for the next 24 hours.  Walking will help expel (get rid of) the air and reduce the bloated feeling in your abdomen.  No driving for 24 hours (because of the anesthesia (medicine) used during the test).  You may shower.  Do not sign any important legal documents or operate any machinery for 24 hours (because of the anesthesia used during the test).  NUTRITION Drink plenty of fluids.  You may resume your normal diet.  Begin with a light meal and progress to your normal diet.  Avoid alcoholic beverages for 24 hours or as instructed by your caregiver.  MEDICATIONS You may resume your normal medications unless your caregiver tells you otherwise.  WHAT YOU CAN EXPECT TODAY You may experience abdominal discomfort such as a feeling of fullness or "gas" pains.  FOLLOW-UP Your doctor will discuss the results of your test with you.  SEEK IMMEDIATE MEDICAL ATTENTION IF ANY OF THE FOLLOWING OCCUR: Excessive nausea (feeling sick to your stomach) and/or vomiting.  Severe abdominal pain and distention (swelling).  Trouble swallowing.  Temperature over 101 F (37.8 C).  Rectal bleeding or vomiting of blood.    Colonoscopy Discharge Instructions  Read the instructions outlined below and refer to this sheet in the next few weeks. These discharge instructions provide you with  general information on caring for yourself after you leave the hospital. Your doctor may also give you specific instructions. While your treatment has been planned according to the most current medical practices available, unavoidable complications occasionally occur. If you have any problems or questions after discharge, call Dr. Jena Gauss at [phone]. ACTIVITY You may resume your regular activity, but move at a slower pace for the next 24 hours.  Take frequent rest periods for the next 24 hours.  Walking will help get rid of the air and reduce the bloated feeling in your belly (abdomen).  No driving for 24 hours (because of the medicine (anesthesia) used during the test).   Do not sign any important legal documents or operate any machinery for 24 hours (because of the anesthesia used during the test).  NUTRITION Drink plenty of fluids.  You may resume your normal diet as instructed by your doctor.  Begin with a light meal and progress to your normal diet. Heavy or fried foods are harder to digest and may make you feel sick to your stomach (nauseated).  Avoid alcoholic beverages for 24 hours or as instructed.  MEDICATIONS You may resume your normal medications unless your doctor tells you otherwise.  WHAT YOU CAN EXPECT TODAY Some feelings of bloating in the abdomen.  Passage of more gas than usual.  Spotting of blood in your stool or on the toilet paper.  IF YOU HAD POLYPS REMOVED DURING THE COLONOSCOPY: No aspirin products for 7 days or as instructed.  No alcohol for 7 days or as instructed.  Eat a soft diet for the next 24 hours.  FINDING  OUT THE RESULTS OF YOUR TEST Not all test results are available during your visit. If your test results are not back during the visit, make an appointment with your caregiver to find out the results. Do not assume everything is normal if you have not heard from your caregiver or the medical facility. It is important for you to follow up on all of your test  results.  SEEK IMMEDIATE MEDICAL ATTENTION IF: You have more than a spotting of blood in your stool.  Your belly is swollen (abdominal distention).  You are nauseated or vomiting.  You have a temperature over 101.  You have abdominal pain or discomfort that is severe or gets worse throughout the day.     Your esophagus was stretched today your esophagus and stomach were mildly inflamed.  Biopsies taken.    You have mild hemorrhoids and a small polyp in the rectum which was removed.  Likely hemorrhoidal bleeding recently   further recommendations to follow pending review of pathology report   please take tranexamic  acid every 8 hours for 7 days   office visit with Ermalinda Memos in 6 weeks   at patient request, I called [employer] at [phone] -  reviewed findings and recommendations

## 2023-08-19 NOTE — Op Note (Addendum)
 [facility] Patient Name: Paul Curry Procedure Date: 08/19/2023 7:58 AM MRN: [id number] Date of Birth: 1971-03-07 Attending MD: Gennette Pac , MD, [id number] CSN: [id number] Age: 53 Admit Type: Outpatient Procedure:                Upper GI endoscopy Indications:              Dysphagia Providers:                Gennette Pac, MD, Francoise Ceo RN, RN,                            Pandora Leiter, Technician Referring MD:              Medicines:                Propofol per Anesthesia Complications:            No immediate complications. Estimated Blood Loss:     Estimated blood loss was minimal. Procedure:                Pre-Anesthesia Assessment:                           - Prior to the procedure, a History and Physical                            was performed, and patient medications and                            allergies were reviewed. The patient's tolerance of                            previous anesthesia was also reviewed. The risks                            and benefits of the procedure and the sedation                            options and risks were discussed with the patient.                            All questions were answered, and informed consent                            was obtained. Prior Anticoagulants: The patient has                            taken no anticoagulant or antiplatelet agents. ASA                            Grade Assessment: III - A patient with severe                            systemic disease. After reviewing the risks and  benefits, the patient was deemed in satisfactory                            condition to undergo the procedure.                           After obtaining informed consent, the endoscope was                            passed under direct vision. Throughout the                            procedure, the patient's blood pressure, pulse, and                            oxygen saturations  were monitored continuously. The                            [id number]-H190 ([id number]) scope was introduced through the                            mouth, and advanced to the second part of duodenum.                            The upper GI endoscopy was accomplished without                            difficulty. The patient tolerated the procedure                            well. Scope In: 8:39:56 AM Scope Out: 8:52:01 AM Total Procedure Duration: 0 hours 12 minutes 5 seconds  Findings:      Undulating Z-line with 1 "tongue" of salmon-colored epithelium coming up       about 1.5 cm from GE junction. No esophagitis no nodularity the tubular       esophagus was patent throughout its course.      Examination of gastric mucosa revealed mildly reticulated appearing       mucoas. No ulcer or infiltrating process. Pylorus patent. The entire       examined stomach was otherwise normal.      The duodenal bulb and second portion of the duodenum were normal. Scope       was withdrawn and a 56 French Maloney dilator was passed to full       insertion with mild resistance. A look back revealed minimal heme (less       than 5 cc). Subsequently the tongue of salmon-colored epithelium was       biopsied. Finally, the abnormal appearing gastric mucosa was biopsied. Impression:               - Abnormal distal esophageal mucosa of doubtful                            clinical significance status post Maloney dilation  followed by biopsy normal stomach.                           -Mildly reticulated appearing stomach of uncertain                            significance?status post biopsy                           - Normal duodenal bulb and second portion of the                            duodenum.                           - Moderate Sedation:      Moderate (conscious) sedation was personally administered by an       anesthesia professional. The following parameters were monitored: oxygen        saturation, heart rate, blood pressure, respiratory rate, EKG, adequacy       of pulmonary ventilation, and response to care. Recommendation:           - Patient has a contact number available for                            emergencies. The signs and symptoms of potential                            delayed complications were discussed with the                            patient. Return to normal activities tomorrow.                            Written discharge instructions were provided to the                            patient.                           - Advance diet as tolerated. Tranexamic acid every                            8 hours x 7 days per [facility] recommendations. See                            colonoscopy report. Procedure Code(s):        --- Professional ---                           [phone], Esophagogastroduodenoscopy, flexible,                            transoral; diagnostic, including collection of                            specimen(s) by brushing or washing,  when performed                            (separate procedure) Diagnosis Code(s):        --- Professional ---                           R13.10, Dysphagia, unspecified CPT copyright 2022 American Medical Association. All rights reserved. The codes documented in this report are preliminary and upon coder review may  be revised to meet current compliance requirements. Gerrit Friends. Anea Fodera, MD Gennette Pac, MD 08/19/2023 9:16:23 AM This report has been signed electronically. Number of Addenda: 0

## 2023-08-19 NOTE — Anesthesia Postprocedure Evaluation (Signed)
 Anesthesia Post Note  Patient: Paul Curry  Procedure(s) Performed: COLONOSCOPY WITH PROPOFOL ESOPHAGOGASTRODUODENOSCOPY (EGD) WITH PROPOFOL MALONEY DILATION BIOPSY POLYPECTOMY  Patient location during evaluation: PACU Anesthesia Type: General Level of consciousness: awake and alert Pain management: pain level controlled Vital Signs Assessment: post-procedure vital signs reviewed and stable Respiratory status: spontaneous breathing, nonlabored ventilation, respiratory function stable and patient connected to nasal cannula oxygen Cardiovascular status: blood pressure returned to baseline and stable Postop Assessment: no apparent nausea or vomiting Anesthetic complications: no   There were no known notable events for this encounter.   Last Vitals:  Vitals:   08/19/23 0923 08/19/23 0929  BP: (!) 82/56 112/80  Pulse: 76   Resp: 13   Temp: 36.6 C   SpO2: 98%     Last Pain:  Vitals:   08/19/23 0923  TempSrc: Oral  PainSc: 0-No pain                 Kaneisha Ellenberger L Shantel Helwig

## 2023-08-19 NOTE — H&P (Signed)
 @LOGO @   Primary Care Physician:  Tamsen Roers, NP Primary Gastroenterologist:  Dr. Jena Gauss  Pre-Procedure History & Physical: HPI:  Paul Curry is a 53 y.o. male here for here for further evaluation of dysphagia GERD and diarrhea rectal bleeding history of colonic polyps removed previously.  Von Willebrand's deficiency  Past Medical History:  Diagnosis Date   Bleeding disorder (HCC) 04/12/2013   Clotting disorder (HCC)    Complication of anesthesia    Diabetes (HCC)    Family history of bladder cancer    Family history of colon cancer 04/13/2023   Family history of colon cancer    Family history of uterine cancer 04/13/2023   Family history of uterine cancer    Hypertension    Knee joint pain 03/29/2013   PONV (postoperative nausea and vomiting)    Qualitative platelet defect (HCC)    Skin cancer    Sleep apnea    Von Willebrand disease (HCC) 03/29/2013    Past Surgical History:  Procedure Laterality Date   BIOPSY  04/30/2020   Procedure: BIOPSY;  Surgeon: Corbin Ade, MD;  Location: [facility];  Service: Endoscopy;;   COLONOSCOPY WITH PROPOFOL N/A 04/30/2020   Surgeon: Corbin Ade, MD;  Two 4-6 mm polyps in the descending colon and cecum, diverticulosis in the descending and sigmoid colon, otherwise normal exam.  Pathology with sessile serrated adenoma in the cecum and hyperplastic polyp in the descending colon.  Recommend repeat colonoscopy in 5 years.   ESOPHAGOGASTRODUODENOSCOPY (EGD) WITH PROPOFOL N/A 04/30/2020   Surgeon: Corbin Ade, MD; abnormal distal esophagus, query short segment Barrett's esophagus s/p dilation and biopsy (pathology consistent with reflux), abnormal duodenum with scattered 1-2 mm white adherent plaques diffusely covering second portion of duodenum s/p biopsy (pathology unremarkable).   KNEE ARTHROSCOPY Left 2010   MALONEY DILATION N/A 04/30/2020   Procedure: Elease Hashimoto DILATION;  Surgeon: Corbin Ade, MD;  Location: [facility]  ENDO SU[facility];  Service: Endoscopy;  Laterality: N/A;   POLYPECTOMY  04/30/2020   Procedure: POLYPECTOMY;  Surgeon: Corbin Ade, MD;  Location: [facility];  Service: Endoscopy;;   REPLACEMENT TOTAL KNEE Right 04/2020   TONSILLECTOMY Bilateral 2000    Prior to Admission medications   Medication Sig Start Date End Date Taking? Authorizing Provider  furosemide (LASIX) 40 MG tablet Take 1 tablet by mouth every morning.   Yes [provider]  hydrALAZINE (APRESOLINE) 25 MG tablet Take 25 mg by mouth 2 (two) times daily at 10 AM and 5 PM.   Yes [provider]  metoprolol succinate (TOPROL-XL) 50 MG 24 hr tablet Take 50 mg by mouth 2 (two) times daily. Take with or immediately following a meal.   Yes [provider]  montelukast (SINGULAIR) 10 MG tablet Take 10 mg by mouth at bedtime.   Yes [provider]  Multiple Vitamin (MULTIVITAMIN WITH MINERALS) TABS tablet Take 1 tablet by mouth daily.   Yes [provider]  omeprazole (PRILOSEC) 40 MG capsule Take 40 mg by mouth daily.    Yes [provider]  PARoxetine (PAXIL) 20 MG tablet Take 20 mg by mouth daily.  03/04/13  Yes [provider]  pioglitazone (ACTOS) 15 MG tablet  10/14/22  Yes [provider]  Potassium Chloride ER 20 MEQ TBCR Take 1 tablet by mouth daily. 10/13/22  Yes [provider]  spironolactone (ALDACTONE) 25 MG tablet Take 1 tablet by mouth daily. 10/21/22  Yes [provider]  tamsulosin (  FLOMAX) 0.4 MG CAPS capsule Take 0.4 mg by mouth daily.   Yes [provider]  terazosin (HYTRIN) 5 MG capsule Take 5 mg by mouth 2 (two) times daily. 07/30/19  Yes [provider]  tranexamic acid (LYSTEDA) 650 MG TABS tablet Take 2 tablets (1,300 mg total) by mouth the night before EGD/colonoscopy and morning of EGD/Colonoscopy  If biopsies are taken or polyps removed, will need to continue 2 tablets every 8 hours for 7 days. 05/28/23  Yes  Harper, Kristen S, PA-C  valsartan-hydrochlorothiazide (DIOVAN-HCT) 320-25 MG tablet Take 1 tablet by mouth daily.    Yes [provider]  acetaminophen (TYLENOL) 325 MG tablet Take 650 mg by mouth every 6 (six) hours as needed for pain.    [provider]    Allergies as of 05/29/2023 - Review Complete 05/11/2023  Allergen Reaction Noted   Lisinopril Cough 12/07/2019   Metformin Diarrhea 02/16/2023    Family History  Problem Relation Age of Onset   Clotting disorder Mother        possibly, not diagnosed   Colon cancer Mother        diagnosed around 70 and again at 13   Cancer - Other Mother 28       ureter   Clotting disorder Maternal Grandmother        possibly had it, not formally diagnosed   Colon cancer Maternal Grandmother 72   Uterine cancer Maternal Grandmother 45   Breast cancer Maternal Grandmother 50   Kidney cancer Maternal Grandmother 9   Colon cancer Maternal Grandfather        d. 34s   Inflammatory bowel disease Neg Hx     Social History   Socioeconomic History   Marital status: Married    Spouse name: Not on file   Number of children: Not on file   Years of education: Not on file   Highest education level: Not on file  Occupational History   Not on file  Tobacco Use   Smoking status: Never   Smokeless tobacco: Never  Substance and Sexual Activity   Alcohol use: Yes    Alcohol/week: 1.0 standard drink of alcohol    Types: 1 Glasses of wine per week    Comment: couple beer a week.    Drug use: No   Sexual activity: Yes    Partners: Female    Comment: spouse  Other Topics Concern   Not on file  Social History Narrative   Not on file   Social Drivers of Health   Financial Resource Strain: Low Risk  (05/17/2020)   [facility] from [facility], [facility]   Overall Financial Resource Strain (CARDIA)    Difficulty of Paying Living Expenses: Not hard at all  Food Insecurity: No Food Insecurity (04/15/2023)   Hunger Vital  Sign    Worried About Running Out of Food in the Last Year: Never true    Ran Out of Food in the Last Year: Never true  Transportation Needs: No Transportation Needs (04/15/2023)   PRAPARE - Administrator, Civil Service (Medical): No    Lack of Transportation (Non-Medical): No  Physical Activity: Not on file  Stress: Not on file  Social Connections: Not on file  Intimate Partner Violence: Not At Risk (04/15/2023)   Humiliation, Afraid, Rape, and Kick questionnaire    Fear of Current or Ex-Partner: No    Emotionally Abused: No    Physically Abused: No  Sexually Abused: No    Review of Systems: See HPI, otherwise negative ROS  Physical Exam: BP (!) 137/92 (BP Location: Right Arm)   Pulse 68   Temp 98.2 F (36.8 C)   Resp 19   SpO2 100%  General:   Alert,  Well-developed, well-nourished, pleasant and cooperative in NAD Neck:  Supple; no masses or thyromegaly. No significant cervical adenopathy. Lungs:  Clear throughout to auscultation.   No wheezes, crackles, or rhonchi. No acute distress. Heart:  Regular rate and rhythm; no murmurs, clicks, rubs,  or gallops. Abdomen: Non-distended, normal bowel sounds.  Soft and nontender without appreciable mass or hepatosplenomegaly.  Pulses:  Normal pulses noted. Extremities:  Without clubbing or edema.  Impression/Plan: 53 year old gentleman with GERD now esophageal dysphagia with diarrhea and rectal bleeding history of colonic polyps here for EGD with esophageal dilation as well as a colonoscopy per plan.  Transamic acid on board per [facility] recommendations.  The risks, benefits, limitations, imponderables and alternatives regarding both EGD and colonoscopy have been reviewed with the patient. Questions have been answered. All parties agreeable.         Notice: This dictation was prepared with Dragon dictation along with smaller phrase technology. Any transcriptional errors that result from this process are unintentional and  may not be corrected upon review.

## 2023-08-19 NOTE — Op Note (Signed)
 [facility] Patient Name: Paul Curry Procedure Date: 08/19/2023 7:57 AM MRN: [id number] Date of Birth: 10/23/1970 Attending MD: Gennette Pac , MD, [id number] CSN: [id number] Age: 53 Admit Type: Outpatient Procedure:                Colonoscopy Indications:              Hematochezia; no diarrhea in 3 months per patient                            report Providers:                Gennette Pac, MD, Francoise Ceo RN, RN,                            Pandora Leiter, Technician Referring MD:              Medicines:                Propofol per Anesthesia Complications:            No immediate complications. Estimated Blood Loss:     Estimated blood loss was minimal. Procedure:                Pre-Anesthesia Assessment:                           - Prior to the procedure, a History and Physical                            was performed, and patient medications and                            allergies were reviewed. The patient's tolerance of                            previous anesthesia was also reviewed. The risks                            and benefits of the procedure and the sedation                            options and risks were discussed with the patient.                            All questions were answered, and informed consent                            was obtained. Prior Anticoagulants: The patient has                            taken no anticoagulant or antiplatelet agents. ASA                            Grade Assessment: III - A patient with severe  systemic disease. After reviewing the risks and                            benefits, the patient was deemed in satisfactory                            condition to undergo the procedure.                           After obtaining informed consent, the colonoscope                            was passed under direct vision. Throughout the                            procedure, the patient's  blood pressure, pulse, and                            oxygen saturations were monitored continuously. The                            [id number] scope was introduced through the                            anus and advanced to the the cecum, identified by                            appendiceal orifice and ileocecal valve. The                            colonoscopy was performed without difficulty. The                            patient tolerated the procedure well. The quality                            of the bowel preparation was adequate. The                            colonoscopy was performed without difficulty. The                            patient tolerated the procedure well. The quality                            of the bowel preparation was adequate. The                            ileocecal valve, appendiceal orifice, and rectum                            were photographed. Scope In: 8:59:55 AM Scope Out: 9:13:06 AM Scope Withdrawal Time: 0 hours 10 minutes 6 seconds  Total Procedure Duration: 0 hours 13 minutes 11 seconds  Findings:      Hemorrhoids were found on perianal exam.      Non-bleeding external and internal hemorrhoids were found during       retroflexion. The hemorrhoids were mild, small and Grade II (internal       hemorrhoids that prolapse but reduce spontaneously).      A 5 mm polyp was found in the mid rectum. The polyp was sessile. The       polyp was removed with a cold snare. Resection and retrieval were       complete. Estimated blood loss was minimal.      The exam was otherwise without abnormality on direct and retroflexion       views. Impression:               - Hemorrhoids found on perianal exam.                           - Non-bleeding external and internal hemorrhoids.                           - One 5 mm polyp in the mid rectum, removed with a                            cold snare. Resected and retrieved.                           - The  examination was otherwise normal on direct                            and retroflexion views. Moderate Sedation:      Moderate (conscious) sedation was personally administered by an       anesthesia professional. The following parameters were monitored: oxygen       saturation, heart rate, blood pressure, respiratory rate, EKG, adequacy       of pulmonary ventilation, and response to care. Recommendation:           - Patient has a contact number available for                            emergencies. The signs and symptoms of potential                            delayed complications were discussed with the                            patient. Return to normal activities tomorrow.                            Written discharge instructions were provided to the                            patient.                           - Advance diet as tolerated.                           -  Continue present medications.                           - Repeat colonoscopy date to be determined after                            pending pathology results are reviewed for                            surveillance.                           - Return to GI office in 6 months. Transexamic acid                            every 8 hours x 7 days. See EGD report Procedure Code(s):        --- Professional ---                           [id number], Colonoscopy, flexible; with removal of                            tumor(s), polyp(s), or other lesion(s) by snare                            technique Diagnosis Code(s):        --- Professional ---                           D12.8, Benign neoplasm of rectum                           K64.1, Second degree hemorrhoids                           K92.1, Melena (includes Hematochezia) CPT copyright 2022 American Medical Association. All rights reserved. The codes documented in this report are preliminary and upon coder review may  be revised to meet current compliance requirements. Gerrit Friends.  Lucylle Foulkes, MD Gennette Pac, MD 08/19/2023 9:21:54 AM This report has been signed electronically. Number of Addenda: 0

## 2023-08-20 ENCOUNTER — Encounter (HOSPITAL_COMMUNITY): Payer: Self-pay | Admitting: Internal Medicine

## 2023-08-20 LAB — SURGICAL PATHOLOGY

## 2023-08-24 ENCOUNTER — Encounter: Payer: Self-pay | Admitting: Internal Medicine

## 2023-10-12 ENCOUNTER — Ambulatory Visit: Payer: BC Managed Care – PPO | Admitting: Gastroenterology

## 2023-10-19 NOTE — Progress Notes (Unsigned)
 Referring Provider: Kenney Peacemaker Pru* Primary Care Physician:  Darnelle Elders, NP Primary GI Physician: Dr. Riley Cheadle  No chief complaint on file.   HPI:   Paul Curry is a 53 y.o. male with history of hypertension, diabetes, von Willebrand disease, sleep apnea, adenomatous colon polyp, GERD, fatty liver with kPa 11.28 May 2021, prior transaminitis that has resolved, presenting today for follow-up of rectal bleeding, dysphagia, GERD, fatty liver.  Last seen in the office 05/11/2023 reporting intermittent rectal bleeding x 1 year.  Hemoglobin have remained within normal limits.  Also with solid food dysphagia.  Typical GERD well-controlled on omeprazole 40 mg daily.  No symptoms of decompensated liver disease.  Plan included EGD with possible dilation, colonoscopy, RUQ ultrasound with elastography, CBC, HFP, FibroSure, ELF.  Also recommended weight loss or diet and exercise for fatty liver.  Ultrasound completed 05/18/2023 showing hepatic steatosis, median KPA 2.2 though test with decreased accuracy.  Labs 05/11/23 with CBC and HFP within normal limits.  ELF 10.75.  Florentina Huntsman FibroSure with F0-F1.  Colonoscopy 08/19/2023: Hemorrhoids on perianal exam, nonbleeding external and internal hemorrhoids, 5 mm polyp resected and retrieved from the rectum.  Pathology with hyperplastic polyp.  Recommended 10-year repeat colonoscopy.  EGD 08/19/2023: Abnormal distal esophageal mucosa of doubtful medical significance s/p dilation followed by biopsy, mildly reticulated appearing stomach of uncertain significance s/p biopsy.  Normal examined duodenum.  Pathology was benign.   Today:  Rectal bleeding:  Dysphagia:  GERD:  Fatty liver:  BMI:  Last A1C:    Past Medical History:  Diagnosis Date   Bleeding disorder (HCC) 04/12/2013   Clotting disorder (HCC)    Complication of anesthesia    Diabetes (HCC)    Family history of bladder cancer    Family history of colon cancer  04/13/2023   Family history of colon cancer    Family history of uterine cancer 04/13/2023   Family history of uterine cancer    Hypertension    Knee joint pain 03/29/2013   PONV (postoperative nausea and vomiting)    Qualitative platelet defect (HCC)    Skin cancer    Sleep apnea    Von Willebrand disease (HCC) 03/29/2013    Past Surgical History:  Procedure Laterality Date   BIOPSY  04/30/2020   Procedure: BIOPSY;  Surgeon: Suzette Espy, MD;  [facility];  Service: Endoscopy;;   BIOPSY  08/19/2023   Procedure: BIOPSY;  Surgeon: Suzette Espy, MD;  [facility];  Service: Endoscopy;;   COLONOSCOPY WITH PROPOFOL  N/A 04/30/2020   Surgeon: Suzette Espy, MD;  Two 4-6 mm polyps in the descending colon and cecum, diverticulosis in the descending and sigmoid colon, otherwise normal exam.  Pathology with sessile serrated adenoma in the cecum and hyperplastic polyp in the descending colon.  Recommend repeat colonoscopy in 5 years.   COLONOSCOPY WITH PROPOFOL  N/A 08/19/2023   Procedure: COLONOSCOPY WITH PROPOFOL ;  Surgeon: Suzette Espy, MD;  [facility];  Service: Endoscopy;  Laterality: N/A;  12:30 pm, asa 3  If any concern for significant bleeding post procedurally, he will need 1 unit of platelets transfused.   ESOPHAGOGASTRODUODENOSCOPY (EGD) WITH PROPOFOL  N/A 04/30/2020   Surgeon: Suzette Espy, MD; abnormal distal esophagus, query short segment Barrett's esophagus s/p dilation and biopsy (pathology consistent with reflux), abnormal duodenum with scattered 1-2 mm white adherent plaques diffusely covering second portion of duodenum s/p biopsy (pathology unremarkable).   ESOPHAGOGASTRODUODENOSCOPY (EGD) WITH PROPOFOL  N/A 08/19/2023  Procedure: ESOPHAGOGASTRODUODENOSCOPY (EGD) WITH PROPOFOL ;  Surgeon: Suzette Espy, MD;  [facility];  Service: Endoscopy;  Laterality: N/A;   KNEE ARTHROSCOPY Left 2010   MALONEY DILATION N/A 04/30/2020    Procedure: [name] DILATION;  Surgeon: Suzette Espy, MD;  [facility];  Service: Endoscopy;  Laterality: N/A;   MALONEY DILATION N/A 08/19/2023   Procedure: [name] DILATION;  Surgeon: Suzette Espy, MD;  [facility];  Service: Endoscopy;  Laterality: N/A;   POLYPECTOMY  04/30/2020   Procedure: POLYPECTOMY;  Surgeon: Suzette Espy, MD;  [facility];  Service: Endoscopy;;   POLYPECTOMY  08/19/2023   Procedure: POLYPECTOMY;  Surgeon: Suzette Espy, MD;  [facility];  Service: Endoscopy;;   REPLACEMENT TOTAL KNEE Right 04/2020   TONSILLECTOMY Bilateral 2000    Current Outpatient Medications  Medication Sig Dispense Refill   acetaminophen (TYLENOL) 325 MG tablet Take 650 mg by mouth every 6 (six) hours as needed for pain.     furosemide (LASIX) 40 MG tablet Take 1 tablet by mouth every morning.     hydrALAZINE (APRESOLINE) 25 MG tablet Take 25 mg by mouth 2 (two) times daily at 10 AM and 5 PM.     metoprolol succinate (TOPROL-XL) 50 MG 24 hr tablet Take 50 mg by mouth 2 (two) times daily. Take with or immediately following a meal.     montelukast (SINGULAIR) 10 MG tablet Take 10 mg by mouth at bedtime.     Multiple Vitamin (MULTIVITAMIN WITH MINERALS) TABS tablet Take 1 tablet by mouth daily.     omeprazole (PRILOSEC) 40 MG capsule Take 40 mg by mouth daily.      PARoxetine (PAXIL) 20 MG tablet Take 20 mg by mouth daily.      pioglitazone (ACTOS) 15 MG tablet      Potassium Chloride ER 20 MEQ TBCR Take 1 tablet by mouth daily.     spironolactone (ALDACTONE) 25 MG tablet Take 1 tablet by mouth daily.     tamsulosin (FLOMAX) 0.4 MG CAPS capsule Take 0.4 mg by mouth daily.     terazosin (HYTRIN) 5 MG capsule Take 5 mg by mouth 2 (two) times daily.     tranexamic acid  (LYSTEDA ) 650 MG TABS tablet Take 2 tablets (1,300 mg total) by mouth the night before EGD/colonoscopy and morning of EGD/Colonoscopy  If biopsies are taken or polyps removed,  will need to continue 2 tablets every 8 hours for 7 days. 46 tablet 0   valsartan-hydrochlorothiazide (DIOVAN-HCT) 320-25 MG tablet Take 1 tablet by mouth daily.      No current facility-administered medications for this visit.    Allergies as of 10/21/2023 - Review Complete 08/19/2023  Allergen Reaction Noted   Lisinopril Cough 12/07/2019   Metformin Diarrhea 02/16/2023    Family History  Problem Relation Age of Onset   Clotting disorder Mother        possibly, not diagnosed   Colon cancer Mother        diagnosed around 82 and again at 2   Cancer - Other Mother 67       ureter   Clotting disorder Maternal Grandmother        possibly had it, not formally diagnosed   Colon cancer Maternal Grandmother 19   Uterine cancer Maternal Grandmother 45   Breast cancer Maternal Grandmother 57   Kidney cancer Maternal Grandmother 60   Colon cancer Maternal Grandfather  d. 8s   Inflammatory bowel disease Neg Hx     Social History   Socioeconomic History   Marital status: Married    Spouse name: Not on file   Number of children: Not on file   Years of education: Not on file   Highest education level: Not on file  Occupational History   Not on file  Tobacco Use   Smoking status: Never   Smokeless tobacco: Never  Substance and Sexual Activity   Alcohol use: Yes    Alcohol/week: 1.0 standard drink of alcohol    Types: 1 Glasses of wine per week    Comment: couple beer a week.    Drug use: No   Sexual activity: Yes    Partners: Female    Comment: spouse  Other Topics Concern   Not on file  Social History Narrative   Not on file   Social Drivers of Health   Financial Resource Strain: Low Risk  (05/17/2020)   Received from [facility]   Overall Financial Resource Strain (CARDIA)    Difficulty of Paying Living Expenses: Not hard at all  Food Insecurity: No Food Insecurity (04/15/2023)   Hunger Vital Sign    Worried About Running Out of Food in  the Last Year: Never true    Ran Out of Food in the Last Year: Never true  Transportation Needs: No Transportation Needs (04/15/2023)   PRAPARE - Administrator, Civil Service (Medical): No    Lack of Transportation (Non-Medical): No  Physical Activity: Not on file  Stress: Not on file  Social Connections: Not on file    Review of Systems: Gen: Denies fever, chills, anorexia. Denies fatigue, weakness, weight loss.  CV: Denies chest pain, palpitations, syncope, peripheral edema, and claudication. Resp: Denies dyspnea at rest, cough, wheezing, coughing up blood, and pleurisy. GI: Denies vomiting blood, jaundice, and fecal incontinence.   Denies dysphagia or odynophagia. Derm: Denies rash, itching, dry skin Psych: Denies depression, anxiety, memory loss, confusion. No homicidal or suicidal ideation.  Heme: Denies bruising, bleeding, and enlarged lymph nodes.  Physical Exam: There were no vitals taken for this visit. General:   Alert and oriented. No distress noted. Pleasant and cooperative.  Head:  Normocephalic and atraumatic. Eyes:  Conjuctiva clear without scleral icterus. Heart:  S1, S2 present without murmurs appreciated. Lungs:  Clear to auscultation bilaterally. No wheezes, rales, or rhonchi. No distress.  Abdomen:  +BS, soft, non-tender and non-distended. No rebound or guarding. No HSM or masses noted. Msk:  Symmetrical without gross deformities. Normal posture. Extremities:  Without edema. Neurologic:  Alert and  oriented x4 Psych:  Normal mood and affect.    Assessment:     Plan:  ***   Shana Daring, PA-C [facility] 10/21/2023

## 2023-10-21 ENCOUNTER — Ambulatory Visit (INDEPENDENT_AMBULATORY_CARE_PROVIDER_SITE_OTHER): Admitting: Gastroenterology

## 2023-10-21 ENCOUNTER — Encounter: Payer: Self-pay | Admitting: Gastroenterology

## 2023-10-21 VITALS — BP 136/84 | HR 65 | Temp 97.8°F | Ht 72.0 in | Wt 314.8 lb

## 2023-10-21 DIAGNOSIS — Z860102 Personal history of hyperplastic colon polyps: Secondary | ICD-10-CM | POA: Diagnosis not present

## 2023-10-21 DIAGNOSIS — K219 Gastro-esophageal reflux disease without esophagitis: Secondary | ICD-10-CM | POA: Diagnosis not present

## 2023-10-21 DIAGNOSIS — Z8719 Personal history of other diseases of the digestive system: Secondary | ICD-10-CM | POA: Diagnosis not present

## 2023-10-21 DIAGNOSIS — R1310 Dysphagia, unspecified: Secondary | ICD-10-CM

## 2023-10-21 DIAGNOSIS — K760 Fatty (change of) liver, not elsewhere classified: Secondary | ICD-10-CM | POA: Diagnosis not present

## 2023-10-21 DIAGNOSIS — K625 Hemorrhage of anus and rectum: Secondary | ICD-10-CM

## 2023-10-21 LAB — HEPATIC FUNCTION PANEL
AG Ratio: 1.9 (calc) (ref 1.0–2.5)
ALT: 22 U/L (ref 9–46)
AST: 25 U/L (ref 10–35)
Albumin: 4.3 g/dL (ref 3.6–5.1)
Alkaline phosphatase (APISO): 55 U/L (ref 35–144)
Bilirubin, Direct: 0.1 mg/dL (ref 0.0–0.2)
Globulin: 2.3 g/dL (ref 1.9–3.7)
Indirect Bilirubin: 0.5 mg/dL (ref 0.2–1.2)
Total Bilirubin: 0.6 mg/dL (ref 0.2–1.2)
Total Protein: 6.6 g/dL (ref 6.1–8.1)

## 2023-10-21 LAB — CBC WITH DIFFERENTIAL/PLATELET
Absolute Lymphocytes: 923 {cells}/uL (ref 850–3900)
Absolute Monocytes: 464 {cells}/uL (ref 200–950)
Basophils Absolute: 81 {cells}/uL (ref 0–200)
Basophils Relative: 1.5 %
Eosinophils Absolute: 173 {cells}/uL (ref 15–500)
Eosinophils Relative: 3.2 %
HCT: 41.1 % (ref 38.5–50.0)
Hemoglobin: 13.6 g/dL (ref 13.2–17.1)
MCH: 29.7 pg (ref 27.0–33.0)
MCHC: 33.1 g/dL (ref 32.0–36.0)
MCV: 89.7 fL (ref 80.0–100.0)
MPV: 10.7 fL (ref 7.5–12.5)
Monocytes Relative: 8.6 %
Neutro Abs: 3758 {cells}/uL (ref 1500–7800)
Neutrophils Relative %: 69.6 %
Platelets: 165 10*3/uL (ref 140–400)
RBC: 4.58 10*6/uL (ref 4.20–5.80)
RDW: 13.2 % (ref 11.0–15.0)
Total Lymphocyte: 17.1 %
WBC: 5.4 10*3/uL (ref 3.8–10.8)

## 2023-10-21 NOTE — Patient Instructions (Signed)
 I am glad you are doing well overall!  Please have blood work completed at [facility].   Continue omeprazole 40 mg daily.  Instructions for fatty liver: Recommend 1-2# weight loss per week until ideal body weight through exercise & diet. Low fat/cholesterol diet.   Avoid sweets, sodas, fruit juices, sweetened beverages like tea, etc. Gradually increase exercise from 15 min daily up to 1 hr per day 5 days/week. Limit alcohol use.  I would encourage you to discuss with your PCP the possibility of starting a GLP-1 agonist to help with diabetes control and weight loss as you previously did well with this type of medication.  I will plan to see you back in 6 months or sooner if needed.  Shana Daring, PA-C [facility]

## 2024-05-04 ENCOUNTER — Telehealth: Payer: Self-pay | Admitting: *Deleted

## 2024-05-04 ENCOUNTER — Encounter: Payer: Self-pay | Admitting: Gastroenterology

## 2024-05-04 ENCOUNTER — Ambulatory Visit: Admitting: Gastroenterology

## 2024-05-04 VITALS — BP 102/54 | HR 82 | Temp 97.4°F | Ht 72.0 in | Wt 293.7 lb

## 2024-05-04 DIAGNOSIS — K7581 Nonalcoholic steatohepatitis (NASH): Secondary | ICD-10-CM | POA: Diagnosis not present

## 2024-05-04 NOTE — Progress Notes (Signed)
 Gastroenterology Office Note     Primary Care Physician:  Kristine Corean Deed, NP  Primary Gastroenterologist: Dr. Shaaron    Chief Complaint   Chief Complaint  Patient presents with   Follow-up    Pt arrives for follow up. Pt states everything is going good. No concerns/questions at this time.      History of Present Illness   Paul Curry is a 53 y.o. male presenting today with a history of hypertension, diabetes, von Willebrand disease, sleep apnea, adenomatous colon polyp, GERD, fatty liver with kPa 11.28 May 2021, prior transaminitis that has resolved,    Ultrasound completed 05/18/2023 showing hepatic steatosis, median KPA 2.2 though test with decreased accuracy. [facility]Sure showed F0-F1. ELF was 10.75 which is more concerning for advanced fibrosis versus cirrhosis. No thrombocytopenia, and LFTs remaining normal. Celiac serologies negative in the past. Negative Hep B and C in 2022.   Going to cardiology in near future. History of HTN but now with normalized BP after weight loss and even hypotension on meds; needs med adjustments.   Omeprazole daily, no dysphagia. Has had wonderful purposeful weight loss on tirzepatide.    Colonoscopy 08/19/2023: Hemorrhoids on perianal exam, nonbleeding external and internal hemorrhoids, 5 mm polyp resected and retrieved from the rectum.  Pathology with hyperplastic polyp.    History of serrated polyp in 2021.    EGD 08/19/2023: Abnormal distal esophageal mucosa of doubtful medical significance s/p dilation followed by biopsy, mildly reticulated appearing stomach of uncertain significance s/p biopsy.  Normal examined duodenum.  Pathology was benign.    Past Medical History:  Diagnosis Date   Bleeding disorder 04/12/2013   Clotting disorder    Complication of anesthesia    Diabetes (HCC)    Family history of bladder cancer    Family history of colon cancer 04/13/2023   Family history of colon cancer    Family history  of uterine cancer 04/13/2023   Family history of uterine cancer    Hypertension    Knee joint pain 03/29/2013   PONV (postoperative nausea and vomiting)    Qualitative platelet defect (HCC)    Skin cancer    Sleep apnea    Von Willebrand disease (HCC) 03/29/2013    Past Surgical History:  Procedure Laterality Date   BIOPSY  04/30/2020   Procedure: BIOPSY;  Surgeon: Shaaron Lamar HERO, MD;  [facility];  Service: Endoscopy;;   BIOPSY  08/19/2023   Procedure: BIOPSY;  Surgeon: Shaaron Lamar HERO, MD;  [facility]: [facility];  Service: Endoscopy;;   COLONOSCOPY WITH PROPOFOL  N/A 04/30/2020   Surgeon: Shaaron Lamar HERO, MD;  Two 4-6 mm polyps in the descending colon and cecum, diverticulosis in the descending and sigmoid colon, otherwise normal exam.  Pathology with sessile serrated adenoma in the cecum and hyperplastic polyp in the descending colon.  Recommend repeat colonoscopy in 5 years.   COLONOSCOPY WITH PROPOFOL  N/A 08/19/2023   Procedure: COLONOSCOPY WITH PROPOFOL ;  Surgeon: Shaaron Lamar HERO, MD;  Location: [facility];  Service: Endoscopy;  Laterality: N/A;  12:30 pm, asa 3  If any concern for significant bleeding post procedurally, he will need 1 unit of platelets transfused.   ESOPHAGOGASTRODUODENOSCOPY (EGD) WITH PROPOFOL  N/A 04/30/2020   Surgeon: Shaaron Lamar HERO, MD; abnormal distal esophagus, query short segment Barrett's esophagus s/p dilation and biopsy (pathology consistent with reflux), abnormal duodenum with scattered 1-2 mm white adherent plaques diffusely covering second portion of duodenum s/p biopsy (pathology unremarkable).  ESOPHAGOGASTRODUODENOSCOPY (EGD) WITH PROPOFOL  N/A 08/19/2023   Procedure: ESOPHAGOGASTRODUODENOSCOPY (EGD) WITH PROPOFOL ;  Surgeon: Shaaron Lamar HERO, MD;  [facility]: [facility];  Service: Endoscopy;  Laterality: N/A;   KNEE ARTHROSCOPY Left 2010   MALONEY DILATION N/A 04/30/2020   Procedure: AGAPITO DILATION;  Surgeon: Shaaron Lamar HERO, MD;   [facility]: [facility];  Service: Endoscopy;  Laterality: N/A;   MALONEY DILATION N/A 08/19/2023   Procedure: AGAPITO DILATION;  Surgeon: Shaaron Lamar HERO, MD;  [facility]: [facility];  Service: Endoscopy;  Laterality: N/A;   POLYPECTOMY  04/30/2020   Procedure: POLYPECTOMY;  Surgeon: Shaaron Lamar HERO, MD;  [facility];  Service: Endoscopy;;   POLYPECTOMY  08/19/2023   Procedure: POLYPECTOMY;  Surgeon: Shaaron Lamar HERO, MD;  [facility];  Service: Endoscopy;;   REPLACEMENT TOTAL KNEE Right 04/2020   TONSILLECTOMY Bilateral 2000    Current Outpatient Medications  Medication Sig Dispense Refill   acetaminophen (TYLENOL) 325 MG tablet Take 650 mg by mouth every 6 (six) hours as needed for pain.     furosemide (LASIX) 40 MG tablet Take 1 tablet by mouth every morning.     metoprolol succinate (TOPROL-XL) 50 MG 24 hr tablet Take 50 mg by mouth 2 (two) times daily. Take with or immediately following a meal.     montelukast (SINGULAIR) 10 MG tablet Take 10 mg by mouth at bedtime.     Multiple Vitamin (MULTIVITAMIN WITH MINERALS) TABS tablet Take 1 tablet by mouth daily.     omeprazole (PRILOSEC) 40 MG capsule Take 40 mg by mouth daily.      PARoxetine (PAXIL) 20 MG tablet Take 20 mg by mouth daily.      Potassium Chloride ER 20 MEQ TBCR Take 1 tablet by mouth daily.     spironolactone (ALDACTONE) 25 MG tablet Take 1 tablet by mouth daily.     terazosin (HYTRIN) 5 MG capsule Take 5 mg by mouth 2 (two) times daily.     valsartan-hydrochlorothiazide (DIOVAN-HCT) 320-25 MG tablet Take 1 tablet by mouth daily.      No current facility-administered medications for this visit.    Allergies as of 05/04/2024 - Review Complete 05/04/2024  Allergen Reaction Noted   Lisinopril Cough 12/07/2019   Metformin Diarrhea 02/16/2023    Family History  Problem Relation Age of Onset   Clotting disorder Mother        possibly, not diagnosed   Colon cancer Mother        diagnosed around  3 and again at 22   Cancer - Other Mother 24       ureter   Clotting disorder Maternal Grandmother        possibly had it, not formally diagnosed   Colon cancer Maternal Grandmother 87   Uterine cancer Maternal Grandmother 45   Breast cancer Maternal Grandmother 50   Kidney cancer Maternal Grandmother 43   Colon cancer Maternal Grandfather        d. 23s   Inflammatory bowel disease Neg Hx     Social History   Socioeconomic History   Marital status: Married    Spouse name: Not on file   Number of children: Not on file   Years of education: Not on file   Highest education level: Not on file  Occupational History   Not on file  Tobacco Use   Smoking status: Never   Smokeless tobacco: Never  Substance and Sexual Activity   Alcohol use: Yes  Alcohol/week: 1.0 standard drink of alcohol    Types: 1 Glasses of wine per week    Comment: couple beer a week.    Drug use: No   Sexual activity: Yes    Partners: Female    Comment: spouse  Other Topics Concern   Not on file  Social History Narrative   Not on file   Social Drivers of Health   Financial Resource Strain: Low Risk (05/17/2020)   Received from [facility]   Overall Financial Resource Strain (CARDIA)    Difficulty of Paying Living Expenses: Not hard at all  Food Insecurity: No Food Insecurity (04/15/2023)   Hunger Vital Sign    Worried About Running Out of Food in the Last Year: Never true    Ran Out of Food in the Last Year: Never true  Transportation Needs: No Transportation Needs (04/15/2023)   PRAPARE - Administrator, Civil Service (Medical): No    Lack of Transportation (Non-Medical): No  Physical Activity: Not on file  Stress: Not on file  Social Connections: Not on file  Intimate Partner Violence: Not At Risk (04/15/2023)   Humiliation, Afraid, Rape, and Kick questionnaire    Fear of Current or Ex-Partner: No    Emotionally Abused: No    Physically Abused: No    Sexually Abused: No      Review of Systems   Gen: Denies any fever, chills, fatigue, weight loss, lack of appetite.  CV: Denies chest pain, heart palpitations, peripheral edema, syncope.  Resp: Denies shortness of breath at rest or with exertion. Denies wheezing or cough.  GI: Denies dysphagia or odynophagia. Denies jaundice, hematemesis, fecal incontinence. GU : Denies urinary burning, urinary frequency, urinary hesitancy MS: Denies joint pain, muscle weakness, cramps, or limitation of movement.  Derm: Denies rash, itching, dry skin Psych: Denies depression, anxiety, memory loss, and confusion Heme: Denies bruising, bleeding, and enlarged lymph nodes.   Physical Exam   BP (!) 90/53   Pulse 82   Temp (!) 97.4 F (36.3 C)   Ht 6' (1.829 m)   Wt 293 lb 11.2 oz (133.2 kg)   BMI 39.83 kg/m  General:   Alert and oriented. Pleasant and cooperative. Well-nourished and well-developed.  Head:  Normocephalic and atraumatic. Eyes:  Without icterus Abdomen:  +BS, soft, non-tender and non-distended. No HSM noted. No guarding or rebound. No masses appreciated.  Rectal:  Deferred  Msk:  Symmetrical without gross deformities. Normal posture. Extremities:  Without edema. Neurologic:  Alert and  oriented x4;  grossly normal neurologically. Skin:  Intact without significant lesions or rashes. Psych:  Alert and cooperative. Normal mood and affect.   Assessment    MASH: ELF elevated at 10.75, no thrombocytopenia, LFTs normal on multiple occasions, celiac serologies negative, negative Hep B and C in 2022. Excellent weight loss on GLP-1. Will update fibrosis scores and schedule fibroscan. would hold off on adding Rezdiffra unless persistent fibrosis despite weight loss. He would like to decrease polypharmacy if at all possible.  Hypotension: took oral meds today, asymptomatic, has had excellent weight loss and needs regimen adjusted. Recommending call PCP> hold off on second dose of metoprolol this evening until  hearing from PCP today.   GERD managed well with omeprazole daily.     PLAN    Fibroscan, ELF,  CBC, CMP Holding off on Rezdiffra unless significant fibrosis 6 month return   Therisa MICAEL Stager, PhD, [facility] Gastroenterology

## 2024-05-04 NOTE — Telephone Encounter (Signed)
 Availity PA for fibroscan: Reference Number [id number]  Status PENDED  Review Reason 1 Initial Utilization Review In Progress.

## 2024-05-04 NOTE — Patient Instructions (Addendum)
 Keep up the great work!  Please have blood work done, and I am referring you for the special scan in [place].   I would rather hold off on another medication as long as the fibrosis score is going in the right direction!  Please call your primary care doctor, as your blood pressure medications need to be adjusted. I would hold off on the second dose of metoprolol this evening until you hear back from them!   We will see you in 6 months!  It was a pleasure to see you today. I want to create trusting relationships with patients and provide genuine, compassionate, and quality care. I truly value your feedback, so please be on the lookout for a survey regarding your visit with me today. I appreciate your time in completing this!         Paul MICAEL Stager, PhD, ANP-BC [facility] Gastroenterology

## 2024-05-18 ENCOUNTER — Encounter (HOSPITAL_BASED_OUTPATIENT_CLINIC_OR_DEPARTMENT_OTHER): Payer: Self-pay | Admitting: Cardiovascular Disease

## 2024-05-18 ENCOUNTER — Ambulatory Visit (HOSPITAL_BASED_OUTPATIENT_CLINIC_OR_DEPARTMENT_OTHER): Admitting: Cardiovascular Disease

## 2024-05-18 VITALS — BP 110/64 | HR 75 | Ht 72.0 in | Wt 295.0 lb

## 2024-05-18 DIAGNOSIS — R42 Dizziness and giddiness: Secondary | ICD-10-CM | POA: Diagnosis not present

## 2024-05-18 NOTE — Patient Instructions (Signed)
 Medication Instructions:   DISCONTINUE Metoprolol.   CHANGE Lasix one ( 1) tablet by mouth ( 40 mg) as needed for swelling.   *If you need a refill on your cardiac medications before your next appointment, please call your pharmacy*  Lab Work:  None ordered.  If you have labs (blood work) drawn today and your tests are completely normal, you will receive your results only by: MyChart Message (if you have MyChart) OR A paper copy in the mail If you have any lab test that is abnormal or we need to change your treatment, we will call you to review the results.  Testing/Procedures:  Your physician has requested that you have a carotid duplex. This test is an ultrasound of the carotid arteries in your neck. It looks at blood flow through these arteries that supply the brain with blood. Allow one hour for this exam. There are no restrictions or special instructions.   Follow-Up: At [facility], you and your health needs are our priority.  As part of our continuing mission to provide you with exceptional heart care, our providers are all part of one team.  This team includes your primary Cardiologist (physician) and Advanced Practice Providers or APPs (Physician Assistants and Nurse Practitioners) who all work together to provide you with the care you need, when you need it.  Your next appointment:   3 month(s)  Provider:   Reche Finder, NP    We recommend signing up for the patient portal called MyChart.  Sign up information is provided on this After Visit Summary.  MyChart is used to connect with patients for Virtual Visits (Telemedicine).  Patients are able to view lab/test results, encounter notes, upcoming appointments, etc.  Non-urgent messages can be sent to your provider as well.   To learn more about what you can do with MyChart, go to [email or web address].

## 2024-05-18 NOTE — Progress Notes (Signed)
 "  [facility] Initial Assessment:    Date:  06/15/2024   ID:  Paul Curry, DOB 03/10/1971, MRN [id number]  PCP:  Kristine Corean Deed, NP  Cardiologist:  None  Nephrologist:  Referring MD: Kristine Corean Deed, NP   CC: Hypertension  History of Present Illness:    Paul Curry is a 53 y.o. male with a hx of hypertension, diabetes, von Willebrand disease, OSA, hepatic steatosis, and transaminitis here to establish care in the [facility].   Discussed the use of AI scribe software for clinical note transcription with the patient, who gave verbal consent to proceed.  History of Present Illness Paul Curry has a 20-year history of hypertension, initially linked to weight gain and stress, requiring multiple medications for control. Despite treatment, he has experienced episodes of epistaxis due to high blood pressure. Recent weight loss of 30 pounds has contributed to improved blood pressure control. Current medications include a combination of antihypertensives and diuretics, such as furosemide, which he takes regularly. Recent imaging, including a renal ultrasound, was performed to check for arterial blockages, and the patient recalls being told that the arteries appeared clear.  He experiences episodes of blurred vision, particularly in one eye, during regular activities. These episodes have occurred approximately six times and are sometimes accompanied by lightheadedness and a sensation of potential fainting. He has not checked his blood pressure during these episodes. No chest pain, shortness of breath, or palpitations.  He has a history of leg swelling, which improved following vein ablation surgery a few years ago. No current leg swelling.  He has a history of diabetes, previously managed with Galantase, but is now on a new injectable medication for the past six weeks due to an increase in his A1c levels. He reports feeling well overall and  is physically active at work, though he does not engage in formal exercise. He uses a CPAP machine regularly and notes a reduction in snoring.  Family history is significant for kidney disease; his grandmother had kidney cancer and was on dialysis, and his mother has stage three kidney failure. He is scheduled for a liver fibroscan next month due to a diagnosis of cirrhosis. He is currently on rosuvastatin for cholesterol management, with recent labs showing good control of his lipid levels.   Previous antihypertensives: Lisinopril Spironolactone Amlodipine Hydralazine Valsartan/hctz  Past Medical History:  Diagnosis Date   Bleeding disorder 04/12/2013   Clotting disorder    Complication of anesthesia    Diabetes (HCC)    Family history of bladder cancer    Family history of colon cancer 04/13/2023   Family history of colon cancer    Family history of uterine cancer 04/13/2023   Family history of uterine cancer    Hypertension    Knee joint pain 03/29/2013   PONV (postoperative nausea and vomiting)    Qualitative platelet defect (HCC)    Skin cancer    Sleep apnea    Von Willebrand disease (HCC) 03/29/2013    Past Surgical History:  Procedure Laterality Date   BIOPSY  04/30/2020   Procedure: BIOPSY;  Surgeon: Shaaron Lamar HERO, MD;  [facility];  Service: Endoscopy;;   BIOPSY  08/19/2023   Procedure: BIOPSY;  Surgeon: Shaaron Lamar HERO, MD;  [facility];  Service: Endoscopy;;   COLONOSCOPY WITH PROPOFOL  N/A 04/30/2020   Surgeon: Shaaron Lamar HERO, MD;  Two 4-6 mm polyps in the descending colon and cecum, diverticulosis in the descending and sigmoid colon,  otherwise normal exam.  Pathology with sessile serrated adenoma in the cecum and hyperplastic polyp in the descending colon.  Recommend repeat colonoscopy in 5 years.   COLONOSCOPY WITH PROPOFOL  N/A 08/19/2023   Procedure: COLONOSCOPY WITH PROPOFOL ;  Surgeon: Shaaron Lamar HERO, MD;  [facility];   Service: Endoscopy;  Laterality: N/A;  12:30 pm, asa 3  If any concern for significant bleeding post procedurally, he will need 1 unit of platelets transfused.   ESOPHAGOGASTRODUODENOSCOPY (EGD) WITH PROPOFOL  N/A 04/30/2020   Surgeon: Shaaron Lamar HERO, MD; abnormal distal esophagus, query short segment Barrett's esophagus s/p dilation and biopsy (pathology consistent with reflux), abnormal duodenum with scattered 1-2 mm white adherent plaques diffusely covering second portion of duodenum s/p biopsy (pathology unremarkable).   ESOPHAGOGASTRODUODENOSCOPY (EGD) WITH PROPOFOL  N/A 08/19/2023   Procedure: ESOPHAGOGASTRODUODENOSCOPY (EGD) WITH PROPOFOL ;  Surgeon: Shaaron Lamar HERO, MD;  [facility];  Service: Endoscopy;  Laterality: N/A;   KNEE ARTHROSCOPY Left 2010   MALONEY DILATION N/A 04/30/2020   Procedure: AGAPITO DILATION;  Surgeon: Shaaron Lamar HERO, MD;  [facility];  Service: Endoscopy;  Laterality: N/A;   MALONEY DILATION N/A 08/19/2023   Procedure: AGAPITO DILATION;  Surgeon: Shaaron Lamar HERO, MD;  [facility];  Service: Endoscopy;  Laterality: N/A;   POLYPECTOMY  04/30/2020   Procedure: POLYPECTOMY;  Surgeon: Shaaron Lamar HERO, MD;  [facility];  Service: Endoscopy;;   POLYPECTOMY  08/19/2023   Procedure: POLYPECTOMY;  Surgeon: Shaaron Lamar HERO, MD;  [facility];  Service: Endoscopy;;   REPLACEMENT TOTAL KNEE Right 04/2020   TONSILLECTOMY Bilateral 2000    Current Medications: Current Meds  Medication Sig   acetaminophen (TYLENOL) 325 MG tablet Take 650 mg by mouth every 6 (six) hours as needed for pain.   albuterol (VENTOLIN HFA) 108 (90 Base) MCG/ACT inhaler INHALE TWO PUFFS BY MOUTH EVERY 4 TO 6 HOURS AS NEEDED   furosemide (LASIX) 40 MG tablet Take 1 tablet by mouth as directed. Take as needed for swelling   hydrALAZINE (APRESOLINE) 50 MG tablet Take 50 mg by mouth in the morning and at bedtime.   montelukast (SINGULAIR) 10 MG  tablet Take 10 mg by mouth at bedtime.   Multiple Vitamin (MULTIVITAMIN WITH MINERALS) TABS tablet Take 1 tablet by mouth daily.   omeprazole (PRILOSEC) 40 MG capsule Take 40 mg by mouth daily.    PARoxetine (PAXIL) 20 MG tablet Take 20 mg by mouth daily.    Potassium Chloride ER 20 MEQ TBCR Take 1 tablet by mouth daily.   rosuvastatin (CRESTOR) 20 MG tablet Take 20 mg by mouth daily.   spironolactone (ALDACTONE) 25 MG tablet Take 1 tablet by mouth daily.   terazosin (HYTRIN) 5 MG capsule Take 5 mg by mouth 2 (two) times daily.   tirzepatide (MOUNJARO) 5 MG/0.5ML Pen Inject 5 mg into the skin once a week.   valsartan-hydrochlorothiazide (DIOVAN-HCT) 320-25 MG tablet Take 1 tablet by mouth daily.    [DISCONTINUED] metoprolol succinate (TOPROL-XL) 50 MG 24 hr tablet Take 50 mg by mouth 2 (two) times daily. Take with or immediately following a meal.     Allergies:   Aspirin, Lisinopril, Metformin, and Other   Social History   Socioeconomic History   Marital status: Married    Spouse name: Not on file   Number of children: Not on file   Years of education: Not on file   Highest education level: Not on file  Occupational  History   Not on file  Tobacco Use   Smoking status: Never    Passive exposure: Never   Smokeless tobacco: Never  Substance and Sexual Activity   Alcohol use: Yes    Alcohol/week: 3.0 standard drinks of alcohol    Types: 1 Glasses of wine, 2 Cans of beer per week    Comment: couple beer a week.    Drug use: Never   Sexual activity: Yes    Partners: Female    Birth control/protection: None    Comment: ED I dont get hard & stay hard sex drive is low  Other Topics Concern   Not on file  Social History Narrative   Not on file   Social Drivers of Health   Tobacco Use: Low Risk (06/15/2024)   Patient History    Smoking Tobacco Use: Never    Smokeless Tobacco Use: Never    Passive Exposure: Never  Financial Resource Strain: Low Risk (05/18/2024)   Overall  Financial Resource Strain (CARDIA)    Difficulty of Paying Living Expenses: Not hard at all  Food Insecurity: No Food Insecurity (05/18/2024)   Epic    Worried About Programme Researcher, Broadcasting/film/video in the Last Year: Never true    Ran Out of Food in the Last Year: Never true  Transportation Needs: No Transportation Needs (05/18/2024)   Epic    Lack of Transportation (Medical): No    Lack of Transportation (Non-Medical): No  Physical Activity: Insufficiently Active (05/18/2024)   Exercise Vital Sign    Days of Exercise per Week: 5 days    Minutes of Exercise per Session: 20 min  Stress: No Stress Concern Present (05/18/2024)   [facility] [employer] - Occupational Stress Questionnaire    Feeling of Stress: Not at all  Social Connections: Moderately Integrated (05/18/2024)   Social Connection and Isolation Panel    Frequency of Communication with Friends and Family: More than three times a week    Frequency of Social Gatherings with Friends and Family: Three times a week    Attends Religious Services: Never    Active Member of Clubs or Organizations: Yes    Attends Banker Meetings: 1 to 4 times per year    Marital Status: Married  Depression (PHQ2-9): Low Risk (04/15/2023)   Depression (PHQ2-9)    PHQ-2 Score: 0  Alcohol Screen: Low Risk (05/18/2024)   Alcohol Screen    Last Alcohol Screening Score (AUDIT): 1  Housing: Low Risk (05/18/2024)   Epic    Unable to Pay for Housing in the Last Year: No    Number of Times Moved in the Last Year: 0    Homeless in the Last Year: No  Utilities: Not At Risk (05/18/2024)   Epic    Threatened with loss of utilities: No  Health Literacy: Adequate Health Literacy (05/18/2024)   B1300 Health Literacy    Frequency of need for help with medical instructions: Never     Family History: The patient's family history includes Breast cancer (age of onset: 74) in his maternal grandmother; Cancer - Other (age of onset: 5) in his  mother; Clotting disorder in his maternal grandmother and mother; Colon cancer in his maternal grandfather and mother; Colon cancer (age of onset: 32) in his maternal grandmother; Kidney cancer (age of onset: 8) in his maternal grandmother; Kidney disease in his maternal grandmother; Uterine cancer (age of onset: 68) in his maternal grandmother. There is no history of Inflammatory bowel disease.  ROS:   Please see the history of present illness.     All other systems reviewed and are negative.  EKGs/Labs/Other Studies Reviewed:    EKG:  EKG is not ordered today.    Recent Labs: 08/17/2023: BUN 26; Creatinine, Ser 1.08; Potassium 4.0; Sodium 137 10/21/2023: ALT 22; Hemoglobin 13.6; Platelets 165   Recent Lipid Panel    Component Value Date/Time   CHOL 207 (H) 05/11/2023 1035   TRIG 107 05/11/2023 1035   08/21/23:  Total cholesterol 110, triglycerides 129, HDL 39, LDL 45  Physical Exam:   VS:  BP 110/64 (BP Location: Left Arm, Patient Position: Sitting, Cuff Size: Large)   Pulse 75   Ht 6' (1.829 m)   Wt 295 lb (133.8 kg)   SpO2 95%   BMI 40.01 kg/m  , BMI Body mass index is 40.01 kg/m. GENERAL:  Well appearing HEENT: Pupils equal round and reactive, fundi not visualized, oral mucosa unremarkable NECK:  No jugular venous distention, waveform within normal limits, carotid upstroke brisk and symmetric, no bruits, no thyromegaly LUNGS:  Clear to auscultation bilaterally HEART:  RRR.  PMI not displaced or sustained,S1 and S2 within normal limits, no S3, no S4, no clicks, no rubs, no murmurs ABD:  Flat, positive bowel sounds normal in frequency in pitch, no bruits, no rebound, no guarding, no midline pulsatile mass, no hepatomegaly, no splenomegaly EXT:  2 plus pulses throughout, no edema, no cyanosis no clubbing SKIN:  No rashes no nodules NEURO:  Cranial nerves II through XII grossly intact, motor grossly intact throughout PSYCH:  Cognitively intact, oriented to person place and  time   ASSESSMENT/PLAN:    Assessment & Plan # Essential hypertension with orthostatic symptoms and transient unilateral vision loss Hypertension well-controlled with recent weight loss. Orthostatic symptoms and transient vision loss suggest possible carotid artery involvement. Blood pressure indicates good control but possible hypotension during activity. Differential includes carotid artery stenosis. - Stopped metoprolol due to erectile dysfunction and weaker antihypertensive effect. - Stopped Lasix, use as needed for swelling. - Ordered carotid Doppler ultrasound to assess for carotid artery stenosis. - Monitor blood pressure and symptoms, aim for average in the 120s. He was mildly orthostatic but not hypotensive in the office today.  # Type 2 diabetes mellitus Recently diagnosed, previously prediabetic. Mounjaro well-tolerated, weight loss aiding glucose control. - Continue Mounjaro. - Encouraged increased physical activity for weight management and glucose control.  # Obesity Significant weight loss achieved, plateau at 296 pounds. Current management includes Mounjaro and dietary modifications. - Encouraged increased physical activity, including cardio and strength training. - Advised on maintaining a nutritious diet with controlled carbohydrate intake.  # Hepatic steatosis: NAFLD with ongoing evaluation by GI.  Mounjaro may benefit liver health. - Continue Mounjaro. - Continue f/u with GI  # Erectile dysfunction Possibly exacerbated by metoprolol. Viagra ineffective. - Stopped metoprolol to assess impact. - Consider referral to urology for further evaluation.  # Obstructive sleep apnea Managed with CPAP, reports improvement in snoring and sleep quality. - Continue CPAP therapy.  # Hyperlipidemia Well-controlled with rosuvastatin. Recent lipid panel indicates good control. - Continue rosuvastatin.  Screening for Secondary Hypertension:     Relevant Labs/Studies:     Latest Ref Rng & Units 08/17/2023    9:13 AM 02/17/2023    3:01 PM 06/07/2021   10:04 AM  Basic Labs  Sodium 135 - 145 mmol/L 137  139  140   Potassium 3.5 - 5.1 mmol/L 4.0  3.4  3.8  Creatinine 0.61 - 1.24 mg/dL 8.91  8.82  9.12        Latest Ref Rng & Units 02/17/2023    3:01 PM  Thyroid   TSH 0.40 - 4.50 mIU/L 2.64      Disposition:    FU with MD/PharmD in 3 months    Medication Adjustments/Labs and Tests Ordered: Current medicines are reviewed at length with the patient today.  Concerns regarding medicines are outlined above.  Orders Placed This Encounter  Procedures   VAS US  CAROTID   No orders of the defined types were placed in this encounter.    Signed, Annabella Scarce, MD  06/15/2024 11:32 AM    [facility]  "

## 2024-06-10 ENCOUNTER — Encounter (INDEPENDENT_AMBULATORY_CARE_PROVIDER_SITE_OTHER)

## 2024-06-10 DIAGNOSIS — R42 Dizziness and giddiness: Secondary | ICD-10-CM | POA: Diagnosis not present

## 2024-06-14 ENCOUNTER — Telehealth (HOSPITAL_BASED_OUTPATIENT_CLINIC_OR_DEPARTMENT_OTHER): Payer: Self-pay | Admitting: *Deleted

## 2024-06-14 NOTE — Telephone Encounter (Signed)
-----   Message from Annabella JINNY Cater sent at 06/10/2024  3:38 PM EST ----- Regarding: Blood pressure meds question Hi,  This patient and his wife were wondering if his meds need adjusting or more added.  His BP is elevated and then too low.   They were here for his carotid ultrasound and live in [place].   Would you be able to call and discuss with him?   Thanks, Alecia

## 2024-06-14 NOTE — Telephone Encounter (Signed)
 Called and spoke w the patient. He reports taking BP in the mornings before meds and getting readings of 140/95, 153/94, 146/86  Lower readings are 110/48, 120/50 - he doesn't know if these are before and after medications.  I gave him BP checking tips and asked him to take BP 3-4 hours after taking the medication.  Keep a list.  Send in readings in few weeks or call and report them.  Reviewed medications.  Pt taking all as prescribed.  Appreciative for call.

## 2024-06-15 ENCOUNTER — Encounter (HOSPITAL_BASED_OUTPATIENT_CLINIC_OR_DEPARTMENT_OTHER): Payer: Self-pay | Admitting: Cardiovascular Disease

## 2024-06-20 ENCOUNTER — Ambulatory Visit (HOSPITAL_BASED_OUTPATIENT_CLINIC_OR_DEPARTMENT_OTHER): Payer: Self-pay | Admitting: Family

## 2024-06-24 ENCOUNTER — Encounter (HOSPITAL_BASED_OUTPATIENT_CLINIC_OR_DEPARTMENT_OTHER): Payer: Self-pay | Admitting: *Deleted

## 2024-06-24 NOTE — Telephone Encounter (Signed)
-----   Message from Annabella Scarce, MD sent at 06/23/2024  2:06 PM EST ----- Regarding: RE: Blood pressure meds question He can increase hydralazine to 100mg  from 50 mg. ----- Message ----- From: Mackin, Alecia J, RVT Sent: 06/10/2024   3:41 PM EST To: Annabella Scarce, MD; Newell KATHEE Birk, LPN Subject: Blood pressure meds question                   Hi,  This patient and his wife were wondering if his meds need adjusting or more added.  His BP is elevated and then too low.   They were here for his carotid ultrasound and live in [place].   Would you be able to call and discuss with him?   Thanks, Alecia

## 2024-06-24 NOTE — Telephone Encounter (Signed)
 Called patient to follow up on blood pressure readings  Left message to call back

## 2024-06-24 NOTE — Telephone Encounter (Signed)
 This encounter was created in error - please disregard.

## 2024-07-20 NOTE — Telephone Encounter (Signed)
 Left message to call back

## 2024-08-05 ENCOUNTER — Ambulatory Visit (HOSPITAL_BASED_OUTPATIENT_CLINIC_OR_DEPARTMENT_OTHER): Admitting: Family
